# Patient Record
Sex: Male | Born: 1955 | Race: White | Hispanic: No | Marital: Single | Smoking: Current every day smoker
Health system: Southern US, Community
[De-identification: ages and names within clinical notes are randomized; demographics above are authoritative.]

## PROBLEM LIST (undated history)

## (undated) DIAGNOSIS — T8859XA Other complications of anesthesia, initial encounter: Secondary | ICD-10-CM

## (undated) DIAGNOSIS — M1990 Unspecified osteoarthritis, unspecified site: Secondary | ICD-10-CM

## (undated) DIAGNOSIS — R112 Nausea with vomiting, unspecified: Secondary | ICD-10-CM

## (undated) DIAGNOSIS — F32A Depression, unspecified: Secondary | ICD-10-CM

## (undated) DIAGNOSIS — Z9889 Other specified postprocedural states: Secondary | ICD-10-CM

## (undated) DIAGNOSIS — Z923 Personal history of irradiation: Secondary | ICD-10-CM

## (undated) DIAGNOSIS — R0600 Dyspnea, unspecified: Secondary | ICD-10-CM

## (undated) HISTORY — PX: NECK SURGERY: SHX720

## (undated) HISTORY — PX: SHOULDER SURGERY: SHX246

## (undated) HISTORY — DX: Personal history of irradiation: Z92.3

## (undated) HISTORY — PX: FACIAL RECONSTRUCTION SURGERY: SHX631

## (undated) HISTORY — PX: OTHER SURGICAL HISTORY: SHX169

---

## 2002-11-22 ENCOUNTER — Inpatient Hospital Stay (HOSPITAL_COMMUNITY): Admission: RE | Admit: 2002-11-22 | Discharge: 2002-11-23 | Payer: Self-pay | Admitting: Neurosurgery

## 2002-11-22 ENCOUNTER — Encounter: Payer: Self-pay | Admitting: Neurosurgery

## 2007-08-11 ENCOUNTER — Ambulatory Visit: Payer: Self-pay | Admitting: Emergency Medicine

## 2007-08-11 ENCOUNTER — Inpatient Hospital Stay (HOSPITAL_COMMUNITY): Admission: EM | Admit: 2007-08-11 | Discharge: 2007-08-15 | Payer: Self-pay | Admitting: Emergency Medicine

## 2007-08-12 ENCOUNTER — Ambulatory Visit: Payer: Self-pay | Admitting: Thoracic Surgery

## 2007-08-12 DIAGNOSIS — R042 Hemoptysis: Secondary | ICD-10-CM | POA: Insufficient documentation

## 2007-08-13 ENCOUNTER — Encounter: Payer: Self-pay | Admitting: Thoracic Surgery

## 2007-08-17 ENCOUNTER — Ambulatory Visit: Payer: Self-pay | Admitting: Critical Care Medicine

## 2007-08-17 DIAGNOSIS — K219 Gastro-esophageal reflux disease without esophagitis: Secondary | ICD-10-CM | POA: Insufficient documentation

## 2007-08-19 ENCOUNTER — Encounter: Payer: Self-pay | Admitting: Critical Care Medicine

## 2007-08-23 ENCOUNTER — Encounter: Admission: RE | Admit: 2007-08-23 | Discharge: 2007-08-23 | Payer: Self-pay | Admitting: Thoracic Surgery

## 2007-08-23 ENCOUNTER — Ambulatory Visit: Payer: Self-pay | Admitting: Thoracic Surgery

## 2007-08-24 ENCOUNTER — Ambulatory Visit (HOSPITAL_COMMUNITY): Admission: RE | Admit: 2007-08-24 | Discharge: 2007-08-24 | Payer: Self-pay | Admitting: Critical Care Medicine

## 2007-08-24 ENCOUNTER — Encounter: Payer: Self-pay | Admitting: Critical Care Medicine

## 2007-09-06 ENCOUNTER — Ambulatory Visit: Payer: Self-pay | Admitting: Thoracic Surgery

## 2007-09-18 ENCOUNTER — Ambulatory Visit: Payer: Self-pay | Admitting: Critical Care Medicine

## 2007-09-18 DIAGNOSIS — F17200 Nicotine dependence, unspecified, uncomplicated: Secondary | ICD-10-CM

## 2007-09-18 DIAGNOSIS — J449 Chronic obstructive pulmonary disease, unspecified: Secondary | ICD-10-CM

## 2010-02-03 ENCOUNTER — Ambulatory Visit (HOSPITAL_BASED_OUTPATIENT_CLINIC_OR_DEPARTMENT_OTHER): Admission: RE | Admit: 2010-02-03 | Discharge: 2010-02-03 | Payer: Self-pay | Admitting: Orthopedic Surgery

## 2010-04-11 ENCOUNTER — Encounter: Payer: Self-pay | Admitting: Critical Care Medicine

## 2010-04-12 ENCOUNTER — Encounter: Payer: Self-pay | Admitting: Thoracic Surgery

## 2010-08-03 NOTE — Discharge Summary (Signed)
NAME:  Brendan Lara, Brendan Lara NO.:  [id number]   MEDICAL RECORD NO.:  [id number]          PATIENT TYPE:  INP   LOCATION:  3039                         FACILITY:  [facility]   PHYSICIAN:  Charlcie Cradle. Delford Field, MD, FCCPDATE OF BIRTH:  May 14, 1955   DATE OF ADMISSION:  08/11/2007  DATE OF DISCHARGE:  08/15/2007                               DISCHARGE SUMMARY   DISCHARGE DIAGNOSES:  1. Hemoptysis.  2. Left lower lobe pulmonary, superior segmental lesion.  3. Nicotine withdrawal.   PROCEDURES:  Video bronchoscopy with biopsy performed on Aug 13, 2007.   LABORATORY DATA:  Sputum fungal smear on Aug 13, 2007, demonstrates no  yeast or fungal elements.  AFB with smear preliminary data obtained on  Aug 13, 2007, demonstrates no acid-fast bacilli.  Bronchial alveolar  lavage completed on Aug 12, 2007, demonstrates no growth.  Sputum  culture on Aug 13, 2007, demonstrates no organisms.  Aug 12, 2007,  sodium 139, potassium 3.7, chloride 106, CO2 24, glucose 102, BUN 14,  and creatinine 0.97.  CBC on Aug 12, 2007, white blood cell count 6,  hemoglobin 15.1, hematocrit 44.3, and platelet count 151.   PATHOLOGY:  Surgical pathology obtained on Aug 13, 2007, demonstrates  benign bronchial mucosa.  No granulomatous inflammation, atypia, or  evidence of malignancy identified.  Cytology obtained on Aug 13, 2007,  demonstrates no atypia or evidence of malignancy in sections identified.   BRIEF HISTORY:  This is a 55 year old white male patient who works in  [employer] demonstrated acute onset of severe hemoptysis, coughing up  at least whole cup of blood into the emergency room.  He was admitted by  Dr. Shan Levans, underwent bronchoscopy and CT scan.  The CT scan  showed areas of alveolitis, and the superior segment of the left lower  lobe, as well as bronchoscopy diagnosing superior segmental lesion in  the left lower lobe that is exophytic.  There were no biopsies done at  that point.   He was having low grade hemoptysis.  He was admitted for  further evaluation and therapy.   HOSPITAL COURSE:  1. Hemoptysis in the setting of left lower lobe superior segment lung      lesion.  Brendan Lara was admitted to the intensive care.  Treatment      included cough suppression, supplemental oxygen, and close      observation.  He also underwent video bronchoscopy with biopsies      obtained.  There were pathology came back without evidence of      malignancy.  However, in the setting of his bleeding lesion,      further evaluation will be carried out in the outpatient setting.      He has plans in the outpatient setting to probably undergo PET scan      after followup with Dr. Edwyna Shell with perhaps question of lobectomy.      This will be deferred to Dr. Edwyna Shell in the outpatient setting.  2. Hemoptysis secondary to problem #1.  This has improved.  The      patient will be discharged to home  with p.r.n. Tussionex.  He was      instructed should this return to call EMS and report to [facility]      Emergency Room.  3. Anxiety most likely in the setting of nicotine withdrawal.  For      this, he has been prescribed to 76-month supply of as needed Xanax.      He was instructed to continue smoking cessation, which he agrees to      do.   DISCHARGE INSTRUCTIONS:  Diet as tolerated.  Increase activity as  tolerated.  Followup Dr. Shan Levans, Friday, Aug 17, 2007, at 10:15  a.m. also with Dr. Karle Plumber in approximately 1 week's time.  His  office will contact Brendan Lara.   DISCHARGE MEDICATIONS:  1. Avelox 400 mg tablet one daily x 5 days then discontinue.  2. QVAR inhaler 40 mcg per spray 2 puffs twice a day.  3. Xanax 0.5 mg tablet 1 tablet as needed every 8 hours for anxiety.  4. Tussionex 1 teaspoon every 12 hours p.r.n. for cough.   DISPOSITION:  Brendan Lara has met maximum benefit from inpatient  hospitalization.  He is being discharged to home in improved status.  He  will  be followed up in the outpatient setting.      Zenia Resides, NP      Charlcie Cradle. Delford Field, MD, [facility]  Electronically Signed    PB/MEDQ  D:  08/15/2007  T:  08/15/2007  Job:  [id number]

## 2010-08-03 NOTE — H&P (Signed)
NAME:  Brendan Lara, Brendan Lara NO.:  [id number]   MEDICAL RECORD NO.:  [id number]          PATIENT TYPE:  EMS   LOCATION:  MAJO                         FACILITY:  [facility]   PHYSICIAN:  Charlcie Cradle. Delford Field, MD, FCCPDATE OF BIRTH:  March 15, 1956   DATE OF ADMISSION:  08/11/2007  DATE OF DISCHARGE:                              HISTORY & PHYSICAL   CHIEF COMPLAINT:  Hemoptysis.   HISTORY OF PRESENT ILLNESS:  This 55 year old white male developed at  9:30 am onset of bright red blood that he is coughing up.  There is  evidence of aspirated blood in the left lower lobe, on CT scan.  We are  asked to admit the patient.  The patient essentially has negative past  medical history, __________ packs per day of cigarettes.  He denied  chest pain.  No fever.  He has not had previous bronchitis or pneumonia.  He is admitted now for further inpatient care.   PAST MEDICAL HISTORY:  Essentially negative.   ALLERGIES:  None.   MEDICATIONS:  Prior to admission none.   FAMILY HISTORY:  Positive for emphysema exposure.   PAST SURGICAL HISTORY:  Negative.   REVIEW OF SYSTEMS:  Noncontributory.   PHYSICAL EXAMINATIONS:  VITAL SIGNS:  Temperature is 97.8, blood  pressure was 116/82, and saturation 97% room air, pulse 74.  HEENT:  Showed no jugular distention.  No lymphadenopathy.  Oropharynx  clear.  NECK:  Supple.  CHEST:  Showed rhonchi, left greater than right.  CARDIAC:  Showed regular rate and rhythm without S3.  Normal S1 and S2.  ABDOMEN:  Soft and nontender.  EXTREMITIES:  Showed no edema or clubbing.  SKIN:  Clear.  NEUROLOGIC:  Intact.   CT scan of chest shows ground-glass changes left lower lobe, otherwise  unremarkable.  There is an apical scar in the right lung 7.3, hemoglobin  16.9, troponin less than 0.05, INR 0.9, PTT 32.  Sodium 140, potassium  4.2, chloride 104, CO2 is 27 with blood sugar 94, creatinine 1.2, and  hemoglobin 17.3.   IMPRESSION:  Massive hemoptysis in a  patient with essentially negative  chest x-ray likely airway involvement, rule out malignancy.   RECOMMENDATIONS:  Pursue bronchoscopy and admit to intensive care bed  and we will follow expectantly.  He will pursue bronchoscopy tonight,  administer IV Rocephin.      Charlcie Cradle Delford Field, MD, [facility]  Electronically Signed     PEW/MEDQ  D:  08/11/2007  T:  08/12/2007  Job:  [id number]

## 2010-08-03 NOTE — Letter (Signed)
August 23, 2007   Charlcie Cradle. Delford Field, MD, FCCP  [address]  [place],  [place] [address]   Re:  GAREN, WOOLBRIGHT              DOB:  [birth year]   Dear Dennie Bible:   I saw Mr. Vernor back in the office today and we got a chest x-ray today  that showed no more changes in his lung. He has had no more hemoptysis  since he went home. He has a PET scan scheduled tomorrow and then we  will see him back again in 2 weeks.  I hope that this is just an  inflammatory situation that caused his bleeding.  I explained to him  that that could be the case or it could be a small occult cancer.  I  will wait to see what his PET scan shows. His blood pressure was 114/75,  pulse 60, respirations 18, sats were 98%.  Lungs are clear to  auscultation and percussion.   Ines Bloomer, M.D.  Electronically Signed   DPB/MEDQ  D:  08/23/2007  T:  08/23/2007  Job:  [id number]

## 2010-08-03 NOTE — Op Note (Signed)
NAMEJEROMY, BORCHERDING NO.:  [id number]   MEDICAL RECORD NO.:  [id number]          PATIENT TYPE:  INP   LOCATION:  2107                         FACILITY:  [facility]   PHYSICIAN:  Ines Bloomer, M.D. DATE OF BIRTH:  09-Apr-1955   DATE OF PROCEDURE:  DATE OF DISCHARGE:                               OPERATIVE REPORT   PREOPERATIVE DIAGNOSIS:  Hemoptysis.   POSTOPERATIVE DIAGNOSIS:  Hemoptysis.   OPERATION PERFORMED:  Video bronchoscopy with biopsy.   SURGEON:  Ines Bloomer, M.D.   ANESTHESIA:  General anesthesia.   This patient came in with fairly acute onset of hemoptysis.  Bronchoscopy by Dr. Shan Levans saw an exophytic lesion in the  superior segment of the left lower lobe.  A CT scan showed airspace  disease in the left lower lobe.  He is brought to the operating room for  biopsy after general anesthesia.  The video bronchoscope was passed  through the endotracheal tube.  The carina was in midline.  The right  upper lobe, right middle lobe, and right lower lobe orifices and  subsegmental orifices were all normal.  The bronchus intermedius and the  right mainstem was normal.  The left mainstem was normal.  However, on  going down to the distal left mainstem, the left upper lobe was  completely cared and there was blood clot occluding the left lower lobe.  The blood clot was irrigated out and the basilar segments of the left  lower lobe were examined.  No endobronchial lesions were seen.  Then, in  the superior segment, more blood clot was irrigated out and we examined  that 3/3 subsegments, and the one of them, there was irregularity in the  orifice and brushings were taken from this area, and then biopsies were  taken of this irregularity.  There was no more bleeding.  The video  bronchoscope was removed.  The patient tolerated the procedure well and  was turned to recovery room in stable condition.      Ines Bloomer, M.D.  Electronically  Signed     DPB/MEDQ  D:  08/13/2007  T:  08/13/2007  Job:  [id number]

## 2010-08-03 NOTE — Letter (Signed)
September 06, 2007   Charlcie Cradle. Delford Field, MD, FCCP  [address]  [place], [place] [address]   Re:  RONNIE, DOO              DOB:  11-05-1955   Dear Dennie Bible,   I saw this patient in the office today and reviewed his CT scan.  He has  not smoked since he was in the hospital.  His CT and PET scan showed no  evidence of cancer and ground-glass appearance in the superior segment  of the left lower lobe now cleared, this was probably secondary to  hemorrhage.  Given that, we have no area to resect, I think the best  thing to do is just to follow him.  He will see you on 06/30/ 2009, and  I will see him in 6 weeks with a chest x-ray.  It might be worth  repeating his bronchoscopy at some point down the line.   I appreciate the opportunity of seeing this patient.   Sincerely,   Ines Bloomer, M.D.  Electronically Signed   DPB/MEDQ  D:  09/06/2007  T:  09/07/2007  Job:  [id number]

## 2010-08-03 NOTE — H&P (Signed)
NAME:  Brendan Lara, Brendan Lara NO.:  [id number]   MEDICAL RECORD NO.:  [id number]           PATIENT TYPE:   LOCATION:                                 FACILITY:   PHYSICIAN:  Ines Bloomer, M.D. DATE OF BIRTH:  [birth year]   DATE OF ADMISSION:  DATE OF DISCHARGE:                              HISTORY & PHYSICAL   CHIEF COMPLAINT:  Hemoptysis.   HISTORY OF PRESENT ILLNESS:  This is a 55 year old Caucasian male, who  works in [employer] had an onset of severe hemoptysis, coughing up at  least cup full of blood into the emergency room and was admitted by Dr.  Shan Levans and underwent a bronchoscopy and a CT scan.  The CT scan  showed areas of alveolitis in the superior segment of the left lower  lobe secondary to probably bleeding.  Bronchoscopy revealed a superior  segmental lesion on left lower lobe that is exophytic.  No biopsy was  done.  He is still having low-grade hemoptysis.  He has had no weight  loss, no fever, chills, and excessive sputum.  He smokes at least 1-2  packs a day.  He has been smoking from many years.  He does not drink  alcohol on regular basis.   PAST MEDICAL HISTORY:  Significant for splenomegaly.  His average PT and  PTT are normal and his platelets are in 78,000.   FAMILY HISTORY:  Noncontributory.   SOCIAL HISTORY:  He works in [employer].  Smokes 1-2 packs a day.  History of cannabis abuse.  He is not consuming alcohol.   REVIEW OF SYSTEMS:  CARDIAC:  No angio or atrial fibrillation.  PULMONARY:  See history of present illness.  He has had some left lower  lobe chest pain.  GI:  No nausea, vomiting, constipation, or diarrhea.  GU:  No dysuria or frequent urination, or kidney disease.  VASCULAR:  No  claudication, DVT, TIAs.  NEUROLOGICAL:  Dizziness, headaches,  blackouts, or seizures.  MUSCULOSKELETAL:  No arthritis or joint pain or  aches.  HEMATOLOGICAL:  The patient has history of splenomegaly.  No  clotting disorders or  bleeding disorders or anemia.  EYES/ENT:  No  changes in eyes or hearing.  PSYCHIATRIC:  No psychiatric illnesses.   PHYSICAL EXAM:  GENERAL:  He is a well pleasant Caucasian male in not  acute distress.  VITAL SIGNS:  His sats were 97%, his blood pressure is 103/80, pulse  100, and respirations 18.  EARS, NOSE, AND THROAT:  Unremarkable.  NECK:  Supple without thyromegaly.  There is no supraclavicular, or  axially adenopathy.  CHEST:  Clear to auscultation and percussion.  HEART:  Regular sinus rhythm.  No murmurs.  ABDOMEN:  Soft.  There is no hepatosplenomegaly.  EXTREMITIES:  Pulses are 2+.  There is no clubbing or edema.  NEUROLOGIC:  He is orient x3.  Sensory and motor intact.  Cranial nerves  were intact.   IMPRESSION:  1. Hemoptysis.  2. Probable pleural ablation.  3. Tobacco abuse.  4. History of splenomegaly.   PLAN:  1. Repeat bronchoscopy with laser  standby.  Biopsy left lower lobe      lesion.  2. Pulmonary function test.  3. PET scan.      Ines Bloomer, M.D.  Electronically Signed     DPB/MEDQ  D:  08/12/2007  T:  08/13/2007  Job:  [id number]

## 2010-12-15 LAB — PROTIME-INR
INR: 0.9
Prothrombin Time: 12.6

## 2010-12-15 LAB — CULTURE, BAL-QUANTITATIVE W GRAM STAIN: Colony Count: NO GROWTH

## 2010-12-15 LAB — CBC
MCHC: 33.8
MCHC: 34
MCV: 94.5
MCV: 94.8
Platelets: 151
Platelets: 178
RDW: 13.4
RDW: 13.6
WBC: 6

## 2010-12-15 LAB — POCT I-STAT, CHEM 8
Calcium, Ion: 1.16
Chloride: 104
Glucose, Bld: 94
HCT: 51

## 2010-12-15 LAB — COMPREHENSIVE METABOLIC PANEL
AST: 14
Albumin: 3.4 — ABNORMAL LOW
Calcium: 8.2 — ABNORMAL LOW
Chloride: 106
Creatinine, Ser: 0.97
GFR calc Af Amer: >60
Total Protein: 5.7 — ABNORMAL LOW

## 2010-12-15 LAB — FUNGUS CULTURE W SMEAR

## 2010-12-15 LAB — CULTURE, RESPIRATORY W GRAM STAIN: Culture: NO GROWTH

## 2010-12-15 LAB — DIFFERENTIAL
Basophils Relative: 1
Eosinophils Absolute: 0.2
Neutrophils Relative %: 59

## 2010-12-15 LAB — AFB CULTURE WITH SMEAR (NOT AT ARMC): Acid Fast Smear: NONE SEEN

## 2010-12-15 LAB — POCT CARDIAC MARKERS: Troponin i, poc: <0.05

## 2013-07-23 ENCOUNTER — Ambulatory Visit
Admission: RE | Admit: 2013-07-23 | Discharge: 2013-07-23 | Disposition: A | Discharge status: Home / Self Care | Payer: No Typology Code available for payment source | Source: Ambulatory Visit | Attending: Physician Assistant | Admitting: Physician Assistant

## 2013-07-23 ENCOUNTER — Other Ambulatory Visit: Payer: Self-pay | Admitting: Physician Assistant

## 2013-07-23 DIAGNOSIS — R05 Cough: Secondary | ICD-10-CM

## 2013-07-23 DIAGNOSIS — R059 Cough, unspecified: Secondary | ICD-10-CM

## 2013-07-23 DIAGNOSIS — R634 Abnormal weight loss: Secondary | ICD-10-CM

## 2013-07-23 DIAGNOSIS — F17200 Nicotine dependence, unspecified, uncomplicated: Secondary | ICD-10-CM

## 2013-08-01 ENCOUNTER — Other Ambulatory Visit: Payer: Self-pay | Admitting: Family Medicine

## 2013-08-01 DIAGNOSIS — R109 Unspecified abdominal pain: Secondary | ICD-10-CM

## 2013-08-05 ENCOUNTER — Ambulatory Visit
Admission: RE | Admit: 2013-08-05 | Discharge: 2013-08-05 | Disposition: A | Discharge status: Home / Self Care | Payer: No Typology Code available for payment source | Source: Ambulatory Visit | Attending: Family Medicine | Admitting: Family Medicine

## 2013-08-05 DIAGNOSIS — R109 Unspecified abdominal pain: Secondary | ICD-10-CM

## 2013-08-15 ENCOUNTER — Other Ambulatory Visit (HOSPITAL_COMMUNITY): Payer: Self-pay | Admitting: Gastroenterology

## 2013-08-15 DIAGNOSIS — R1013 Epigastric pain: Secondary | ICD-10-CM

## 2013-08-21 ENCOUNTER — Other Ambulatory Visit: Payer: Self-pay | Admitting: Gastroenterology

## 2013-08-29 ENCOUNTER — Ambulatory Visit (HOSPITAL_COMMUNITY)
Admission: RE | Admit: 2013-08-29 | Discharge: 2013-08-29 | Disposition: A | Discharge status: Home / Self Care | Payer: No Typology Code available for payment source | Source: Ambulatory Visit | Attending: Gastroenterology | Admitting: Gastroenterology

## 2013-08-29 DIAGNOSIS — R1013 Epigastric pain: Secondary | ICD-10-CM | POA: Insufficient documentation

## 2013-08-29 MED ORDER — TECHNETIUM TC 99M MEBROFENIN IV KIT
5.1000 | PACK | Freq: Once | INTRAVENOUS | Status: AC | PRN
  Administered 2013-08-29: 5 via INTRAVENOUS

## 2014-11-24 IMAGING — NM NM HEPATO W/GB/PHARM/[PERSON_NAME]
1 series · 12 of 12 positions shown · non-contrast
Comparison: None.

CLINICAL DATA: Epigastric region pain

EXAM:
NUCLEAR MEDICINE HEPATOBILIARY IMAGING WITH GALLBLADDER EF
Views:  Anterior right upper quadrant
Radionuclide:  Technetium 99m Choletec
Dose:  5.1 mCi
Route of administration: Intravenous

[Series 1: gbef ensure · 4.46mm/px · 2 acquisitions, 12 frames shown]
[im 1/2]
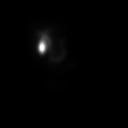
[im 1/2]
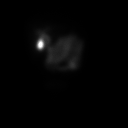
[im 1/2]
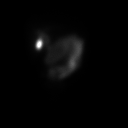
[im 1/2]
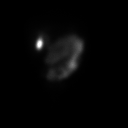
[im 1/2]
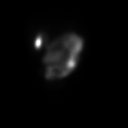
[im 1/2]
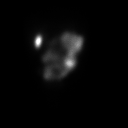
[im 2/2]
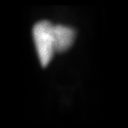
[im 2/2]
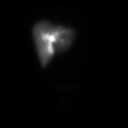
[im 2/2]
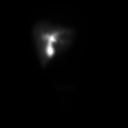
[im 2/2]
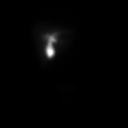
[im 2/2]
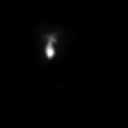
[im 2/2]
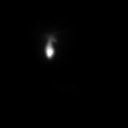

[12 of 12 positions shown; findings below may reference images not displayed]

FINDINGS: Liver uptake of radiotracer is normal. There is prompt visualization
of gallbladder and small bowel, indicating patency of the cystic and
common bile ducts.

The patient consumed 8 oz of Ensure Plus orally with calculation of
the computer generated ejection fraction of radiotracer from the
gallbladder. The patient did not experience pain with Ensure Plus
consumption. The computer generated ejection fraction of radiotracer
from the gallbladder is normal at 79.1%, normal greater than 33%
using the oral agent.
IMPRESSION: Study within normal limits.

## 2015-05-21 ENCOUNTER — Ambulatory Visit
Admission: RE | Admit: 2015-05-21 | Discharge: 2015-05-21 | Disposition: A | Discharge status: Home / Self Care | Payer: No Typology Code available for payment source | Source: Ambulatory Visit | Attending: Cardiology | Admitting: Cardiology

## 2015-05-21 ENCOUNTER — Other Ambulatory Visit: Payer: Self-pay | Admitting: Cardiology

## 2015-05-21 DIAGNOSIS — R0789 Other chest pain: Secondary | ICD-10-CM

## 2015-09-11 ENCOUNTER — Other Ambulatory Visit: Payer: Self-pay | Admitting: Gastroenterology

## 2016-08-15 IMAGING — CR DG CHEST 2V
2 series · 2 of 2 positions shown · non-contrast
Comparison: 07/23/2013

CLINICAL DATA: Cough and congestion

EXAM:
CHEST  2 VIEW

[w chest pa]
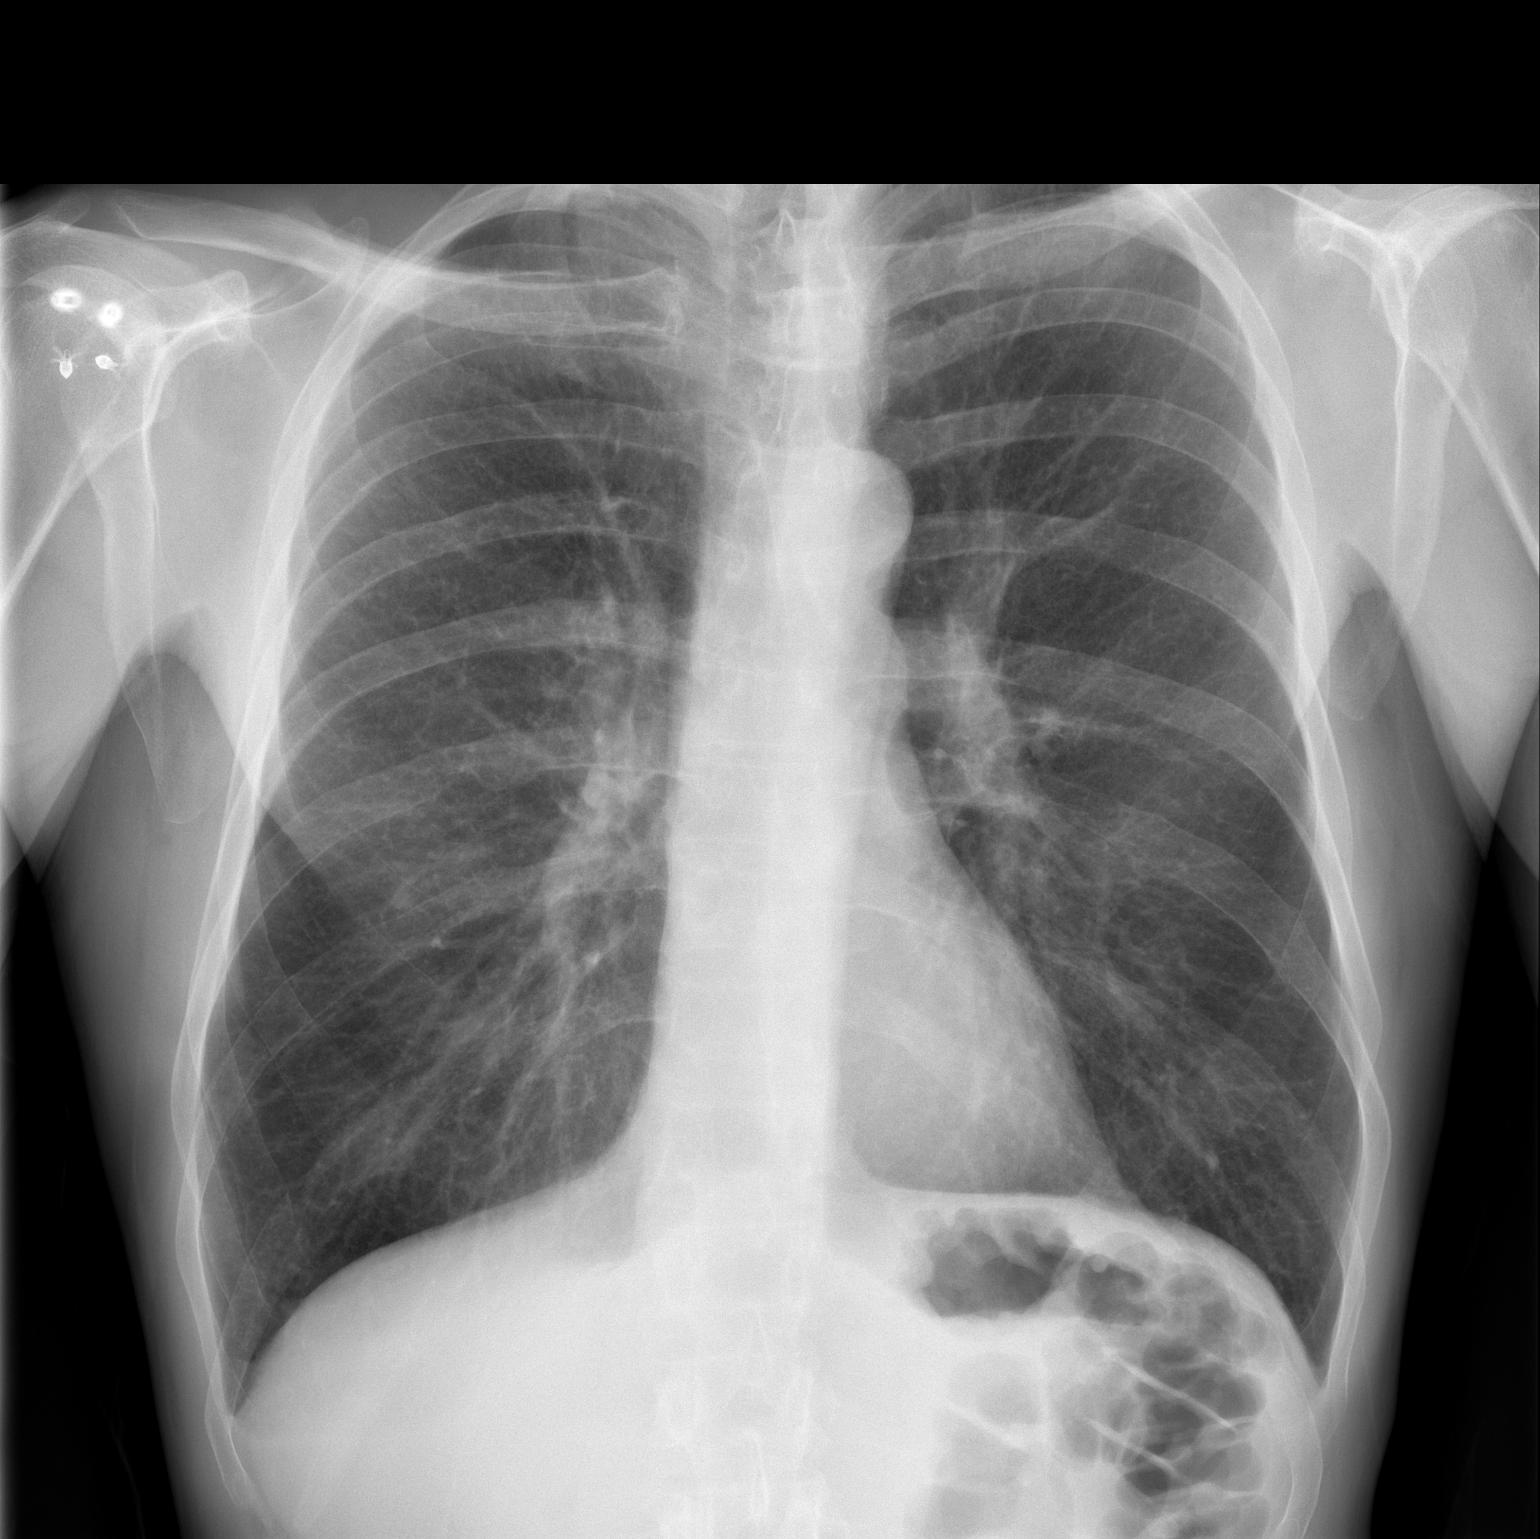

[w chest lat]
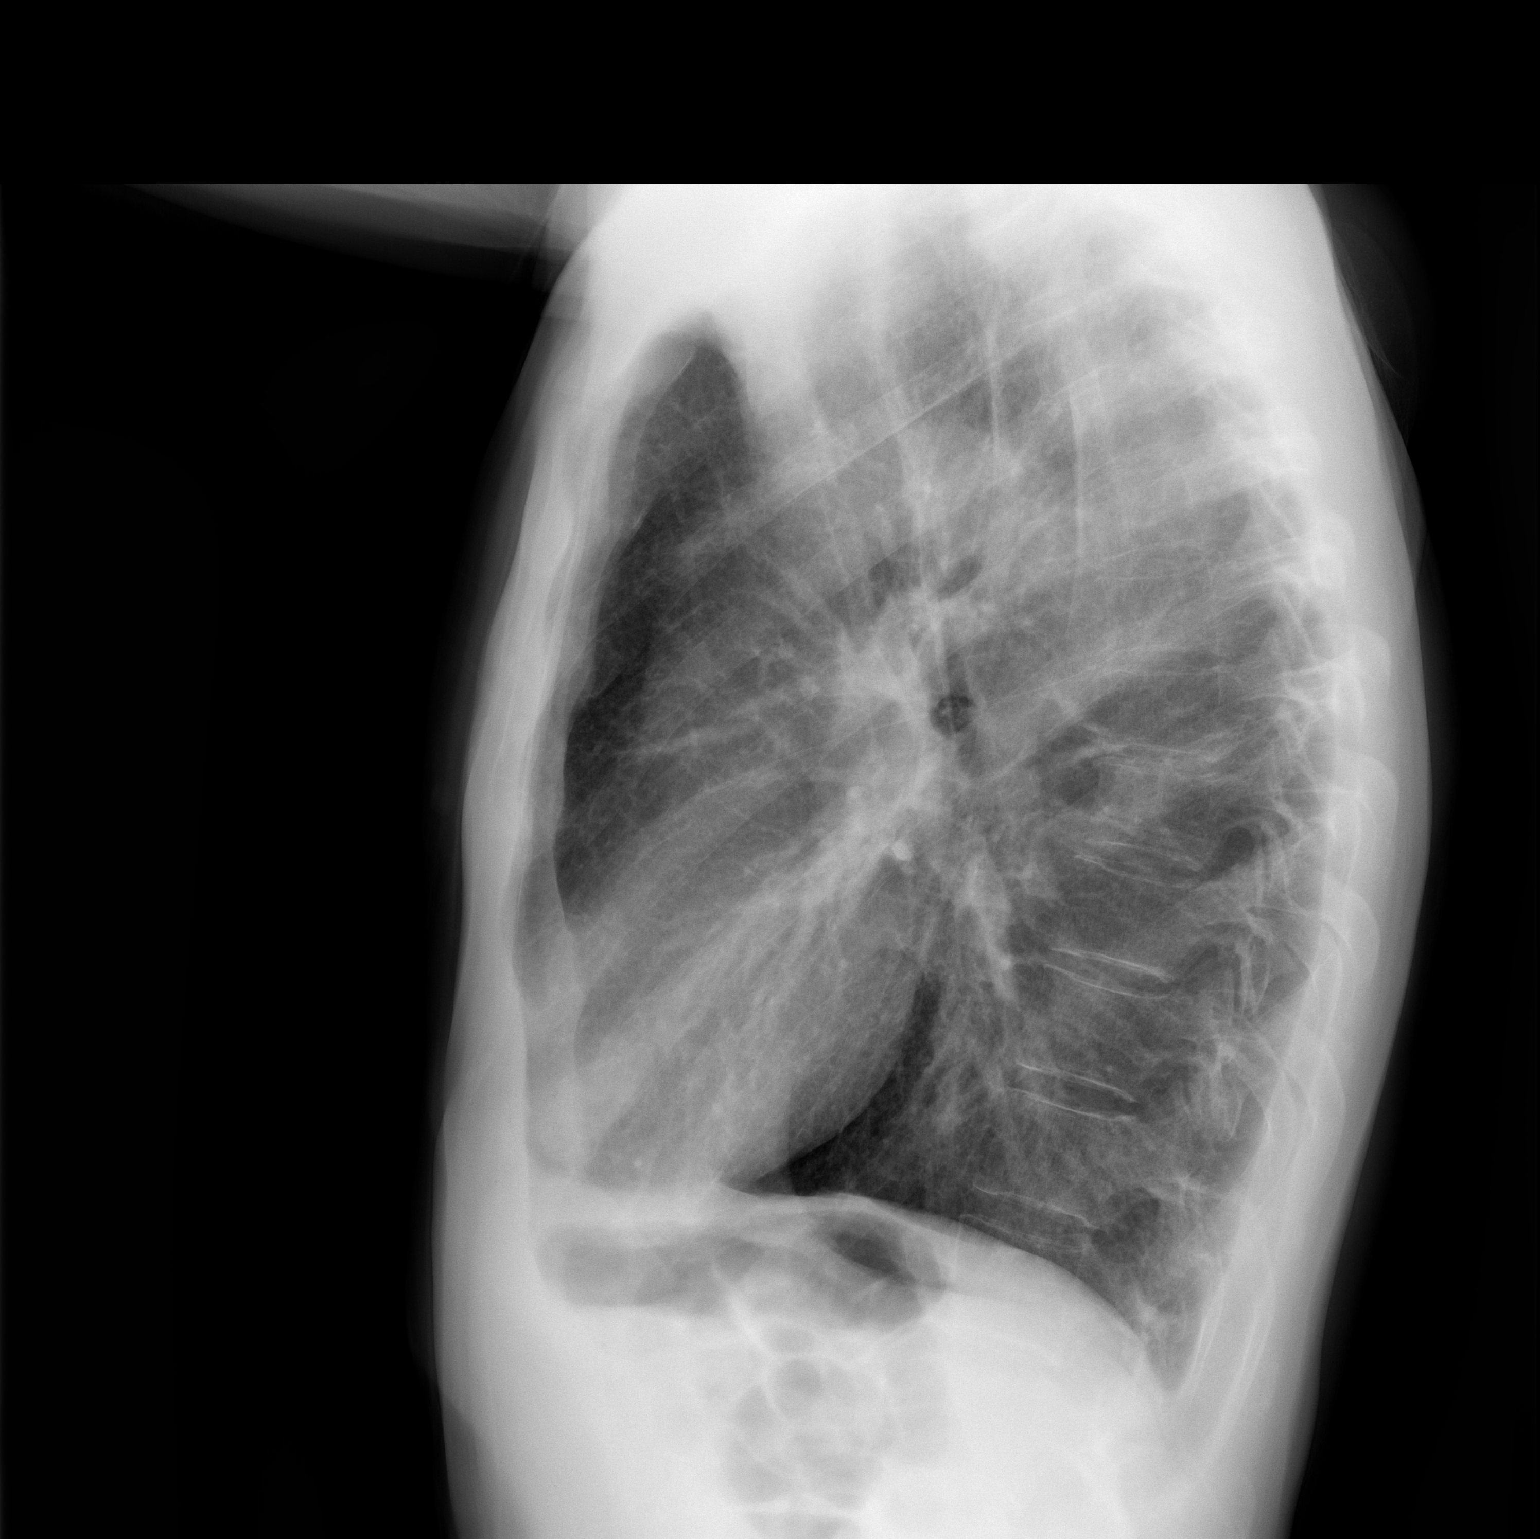

[2 of 2 positions shown; findings below may reference images not displayed]

FINDINGS: Cardiac shadow is within normal limits. The lungs are clear
bilaterally. Mild hyperinflation is noted consistent with COPD.
Postsurgical changes in the right shoulder and cervical spine are
seen.
IMPRESSION: COPD without acute abnormality.

## 2018-05-18 ENCOUNTER — Emergency Department (HOSPITAL_COMMUNITY)
Admission: EM | Admit: 2018-05-18 | Discharge: 2018-05-18 | Disposition: A | Discharge status: Home / Self Care | Payer: No Typology Code available for payment source | Attending: Emergency Medicine | Admitting: Emergency Medicine

## 2018-05-18 ENCOUNTER — Emergency Department (HOSPITAL_COMMUNITY): Payer: No Typology Code available for payment source

## 2018-05-18 ENCOUNTER — Other Ambulatory Visit: Payer: Self-pay

## 2018-05-18 ENCOUNTER — Encounter (HOSPITAL_COMMUNITY): Payer: Self-pay

## 2018-05-18 DIAGNOSIS — R05 Cough: Secondary | ICD-10-CM

## 2018-05-18 DIAGNOSIS — R059 Cough, unspecified: Secondary | ICD-10-CM

## 2018-05-18 DIAGNOSIS — F17210 Nicotine dependence, cigarettes, uncomplicated: Secondary | ICD-10-CM | POA: Insufficient documentation

## 2018-05-18 DIAGNOSIS — E876 Hypokalemia: Secondary | ICD-10-CM | POA: Insufficient documentation

## 2018-05-18 DIAGNOSIS — J189 Pneumonia, unspecified organism: Secondary | ICD-10-CM | POA: Insufficient documentation

## 2018-05-18 LAB — CBC
HCT: 46.5 % (ref 39.0–52.0)
Hemoglobin: 15.9 g/dL (ref 13.0–17.0)
MCH: 31.5 pg (ref 26.0–34.0)
MCHC: 34.2 g/dL (ref 30.0–36.0)
MCV: 92.3 fL (ref 80.0–100.0)
Platelets: 149 10*3/uL — ABNORMAL LOW (ref 150–400)
RBC: 5.04 MIL/uL (ref 4.22–5.81)
RDW: 12.8 % (ref 11.5–15.5)
WBC: 4.9 10*3/uL (ref 4.0–10.5)
nRBC: 0 % (ref 0.0–0.2)

## 2018-05-18 LAB — BASIC METABOLIC PANEL
Anion gap: 12 (ref 5–15)
BUN: 14 mg/dL (ref 8–23)
CHLORIDE: 91 mmol/L — AB (ref 98–111)
CO2: 29 mmol/L (ref 22–32)
CREATININE: 0.71 mg/dL (ref 0.61–1.24)
Calcium: 8.4 mg/dL — ABNORMAL LOW (ref 8.9–10.3)
GFR calc Af Amer: >60 mL/min (ref >60)
GFR calc non Af Amer: >60 mL/min (ref >60)
Glucose, Bld: 114 mg/dL — ABNORMAL HIGH (ref 70–99)
Potassium: 3 mmol/L — ABNORMAL LOW (ref 3.5–5.1)
Sodium: 132 mmol/L — ABNORMAL LOW (ref 135–145)

## 2018-05-18 LAB — I-STAT TROPONIN, ED: Troponin i, poc: 0.02 ng/mL (ref 0.00–0.08)

## 2018-05-18 MED ORDER — ONDANSETRON 4 MG PO TBDP: 4.0000 mg | ORAL_TABLET | Freq: Three times a day (TID) | ORAL | 0 refills | Status: DC | PRN

## 2018-05-18 MED ORDER — DOXYCYCLINE HYCLATE 100 MG PO TABS
100.0000 mg | ORAL_TABLET | Freq: Once | ORAL | Status: AC
  Administered 2018-05-18: 100 mg via ORAL
  Filled 2018-05-18: qty 1

## 2018-05-18 MED ORDER — SODIUM CHLORIDE 0.9 % IV BOLUS
1000.0000 mL | Freq: Once | INTRAVENOUS | Status: AC
  Administered 2018-05-18: 1000 mL via INTRAVENOUS

## 2018-05-18 MED ORDER — PREDNISONE 20 MG PO TABS
60.0000 mg | ORAL_TABLET | Freq: Once | ORAL | Status: AC
  Administered 2018-05-18: 60 mg via ORAL
  Filled 2018-05-18: qty 3

## 2018-05-18 MED ORDER — PREDNISONE 20 MG PO TABS: 40.0000 mg | ORAL_TABLET | Freq: Every day | ORAL | 0 refills | Status: AC

## 2018-05-18 MED ORDER — CYCLOBENZAPRINE HCL 10 MG PO TABS: 10.0000 mg | ORAL_TABLET | Freq: Two times a day (BID) | ORAL | 0 refills | Status: DC | PRN

## 2018-05-18 MED ORDER — POTASSIUM CHLORIDE CRYS ER 20 MEQ PO TBCR
40.0000 meq | EXTENDED_RELEASE_TABLET | Freq: Once | ORAL | Status: AC
  Administered 2018-05-18: 40 meq via ORAL
  Filled 2018-05-18: qty 2

## 2018-05-18 NOTE — Discharge Instructions (Addendum)
You were evaluated in the Emergency Department and after careful evaluation, we did not find any emergent condition requiring admission or further testing in the hospital.  Your symptoms today seem to be due to pneumonia.  Please continue to take the antibiotics at home.  We are also providing you with a steroid medication as well as muscle relaxers for your soreness.  The muscle relaxers can make you sleepy.  Use the zofran nausea medication to help increase your diet.  Please return to the Emergency Department if you experience any worsening of your condition.  We encourage you to follow up with a primary care provider.  Thank you for allowing [place] to be a part of your care.

## 2018-05-18 NOTE — ED Provider Notes (Signed)
[facility] Emergency Department Provider Note MRN:  [id number]  Arrival date & time: 05/18/18     Chief Complaint   sent by physician; Pneumonia; and Chest Pain   History of Present Illness   Brendan Lara is a 63 y.o. year-old male with a history of tobacco abuse presenting to the ED with chief complaint of cough.  Patient has been experiencing cold-like symptoms for 1 week.  Nasal congestion, cough, general malaise, body aches.  Felt better for 1 to 2 days, now feeling worse.  Continued cough.  Diagnosed with pneumonia yesterday and was told to come to the emergency department for fluids and antibiotics.  Felt too weak to go to the hospital yesterday, here today.  Endorsing continued fever at home, generalized weakness, chest pain to the bilateral lower ribs during coughing.  Denies abdominal pain.  Symptoms are constant, no exacerbating relieving factors.  Review of Systems  A complete 10 system review of systems was obtained and all systems are negative except as noted in the HPI and PMH.   Patient's Health History   History reviewed. No pertinent past medical history.  Past Surgical History:  Procedure Laterality Date  . FACIAL RECONSTRUCTION SURGERY    . head surgery    . NECK SURGERY    . SHOULDER SURGERY      Family History  Problem Relation Age of Onset  . Thyroid disease Mother   . Alzheimer's disease Mother   . Heart failure Father     Social History   Socioeconomic History  . Marital status: Single    Spouse name: Not on file  . Number of children: Not on file  . Years of education: Not on file  . Highest education level: Not on file  Occupational History  . Not on file  Social Needs  . Financial resource strain: Not on file  . Food insecurity:    Worry: Not on file    Inability: Not on file  . Transportation needs:    Medical: Not on file    Non-medical: Not on file  Tobacco Use  . Smoking status: Current Every Day Smoker   Packs/day: 1.50    Types: Cigarettes  . Smokeless tobacco: Never Used  Substance and Sexual Activity  . Alcohol use: Not Currently  . Drug use: Yes    Types: Marijuana    Comment: once weekly  . Sexual activity: Not on file  Lifestyle  . Physical activity:    Days per week: Not on file    Minutes per session: Not on file  . Stress: Not on file  Relationships  . Social connections:    Talks on phone: Not on file    Gets together: Not on file    Attends religious service: Not on file    Active member of club or organization: Not on file    Attends meetings of clubs or organizations: Not on file    Relationship status: Not on file  . Intimate partner violence:    Fear of current or ex partner: Not on file    Emotionally abused: Not on file    Physically abused: Not on file    Forced sexual activity: Not on file  Other Topics Concern  . Not on file  Social History Narrative  . Not on file     Physical Exam  Vital Signs and Nursing Notes reviewed Vitals:   05/18/18 1659 05/18/18 1943  BP: 113/71 110/78  Pulse: 70  64  Resp: 15 20  Temp: 97.7 F (36.5 C)   SpO2: 93% 96%    CONSTITUTIONAL: Tired-appearing, NAD NEURO:  Alert and oriented x 3, no focal deficits EYES:  eyes equal and reactive ENT/NECK:  no LAD, no JVD CARDIO: Regular rate, well-perfused, normal S1 and S2 PULM:  CTAB no wheezing or rhonchi GI/GU:  normal bowel sounds, non-distended, non-tender MSK/SPINE:  No gross deformities, no edema SKIN:  no rash, atraumatic PSYCH:  Appropriate speech and behavior  Diagnostic and Interventional Summary    EKG Interpretation  Date/Time:  Friday May 18 2018 17:00:45 EST Ventricular Rate:  72 PR Interval:    QRS Duration: 94 QT Interval:  417 QTC Calculation: 457 R Axis:   53 Text Interpretation:  Sinus or ectopic atrial rhythm Atrial premature complexes Baseline wander in lead(s) V1 wandering baseline otherwise nornal no change from previous Confirmed by  Arby Barrette [phone]) on 05/18/2018 5:17:02 PM      Labs Reviewed  CBC - Abnormal; Notable for the following components:      Result Value   Platelets 149 (*)    All other components within normal limits  BASIC METABOLIC PANEL - Abnormal; Notable for the following components:   Sodium 132 (*)    Potassium 3.0 (*)    Chloride 91 (*)    Glucose, Bld 114 (*)    Calcium 8.4 (*)    All other components within normal limits  I-STAT TROPONIN, ED    DG Chest 2 View  Final Result      Medications  predniSONE (DELTASONE) tablet 60 mg (has no administration in time range)  doxycycline (VIBRA-TABS) tablet 100 mg (has no administration in time range)  potassium chloride SA (K-DUR,KLOR-CON) CR tablet 40 mEq (has no administration in time range)  sodium chloride 0.9 % bolus 1,000 mL (1,000 mLs Intravenous New Bag/Given 05/18/18 2011)     Procedures Critical Care  ED Course and Medical Decision Making  I have reviewed the triage vital signs and the nursing notes.  Pertinent labs & imaging results that were available during my care of the patient were reviewed by me and considered in my medical decision making (see below for details).  Patient is with normal vital signs and clear lungs, appears tired, question of whether providers yesterday wanted him to be admitted for some reason, according to patient was told to come to get IV fluids, will obtain labs to evaluate for AKI, repeat chest x-ray, ambulatory oxygenation test.  Has been taking doxycycline at home.  Chest x-ray confirms pneumonia.  Labs reveal mild hyponatremia, mild hypokalemia.  Patient was ambulated with pulse ox and did well.  No indication for admission at this time, electrolytes repleted here in the emergency department, provided with first dose of prednisone, will provide burst prescription, advised to continue taking doxycycline at home.  After the discussed management above, the patient was determined to be safe for  discharge.  The patient was in agreement with this plan and all questions regarding their care were answered.  ED return precautions were discussed and the patient will return to the ED with any significant worsening of condition.  Elmer Sow. Pilar Plate, MD [facility] Emergency Medicine [facility] [email or web address]  Final Clinical Impressions(s) / ED Diagnoses     ICD-10-CM   1. Community acquired pneumonia, unspecified laterality J18.9   2. Cough R05 DG Chest 2 View    DG Chest 2 View  3. Hypokalemia E87.6  ED Discharge Orders         Ordered    cyclobenzaprine (FLEXERIL) 10 MG tablet  2 times daily PRN     05/18/18 2128    predniSONE (DELTASONE) 20 MG tablet  Daily     05/18/18 2128    ondansetron (ZOFRAN ODT) 4 MG disintegrating tablet  Every 8 hours PRN     05/18/18 2129             Sabas Sous, MD 05/18/18 2131

## 2018-05-18 NOTE — ED Triage Notes (Signed)
Patient states he has been having a productive cough with yellow/green sputum x 1 week. Patient went to [facility] walk in clinic and was told last night that he had pneumonia. Patient decided today that he needed to be seen.

## 2018-05-18 NOTE — ED Notes (Signed)
PT DISCHARGED. INSTRUCTIONS AND PRESCRIPTIONS GIVEN. AAOX4. PT IN NO APPARENT DISTRESS OR PAIN. THE OPPORTUNITY TO ASK QUESTIONS WAS PROVIDED. 

## 2018-05-18 NOTE — ED Notes (Signed)
Pt ambulated roughly 100 ft without assistance. Pt maintained O2 saturation between 94 and 97% with no complaints. HR 84 to 90 bpm

## 2021-11-30 ENCOUNTER — Other Ambulatory Visit: Payer: Self-pay | Admitting: Physician Assistant

## 2021-12-01 ENCOUNTER — Other Ambulatory Visit: Payer: Self-pay | Admitting: Physician Assistant

## 2021-12-01 DIAGNOSIS — F17200 Nicotine dependence, unspecified, uncomplicated: Secondary | ICD-10-CM

## 2021-12-06 ENCOUNTER — Ambulatory Visit
Admission: RE | Admit: 2021-12-06 | Discharge: 2021-12-06 | Disposition: A | Discharge status: Home / Self Care | Payer: Self-pay | Source: Ambulatory Visit | Attending: Physician Assistant | Admitting: Physician Assistant

## 2021-12-06 DIAGNOSIS — F17200 Nicotine dependence, unspecified, uncomplicated: Secondary | ICD-10-CM

## 2022-05-09 ENCOUNTER — Other Ambulatory Visit: Payer: Self-pay

## 2022-05-09 DIAGNOSIS — Z87891 Personal history of nicotine dependence: Secondary | ICD-10-CM

## 2022-05-09 DIAGNOSIS — F17210 Nicotine dependence, cigarettes, uncomplicated: Secondary | ICD-10-CM

## 2022-05-24 ENCOUNTER — Encounter: Payer: Medicare (Managed Care) | Admitting: Pulmonary Disease

## 2022-05-25 ENCOUNTER — Other Ambulatory Visit: Payer: Self-pay

## 2022-05-27 ENCOUNTER — Encounter: Payer: Self-pay | Admitting: Acute Care

## 2022-05-27 ENCOUNTER — Ambulatory Visit (INDEPENDENT_AMBULATORY_CARE_PROVIDER_SITE_OTHER): Payer: Medicare (Managed Care) | Admitting: Acute Care

## 2022-05-27 DIAGNOSIS — F17210 Nicotine dependence, cigarettes, uncomplicated: Secondary | ICD-10-CM

## 2022-05-27 NOTE — Progress Notes (Signed)
Virtual Visit via Telephone Note  I connected with ANTE CANSLER on 05/27/22 at 10:30 AM EST by telephone and verified that I am speaking with the correct person using two identifiers.  Location: Patient:  At home Provider:  [address], [place], [place], [address]    I discussed the limitations, risks, security and privacy concerns of performing an evaluation and management service by telephone and the availability of in person appointments. I also discussed with the patient that there may be a patient responsible charge related to this service. The patient expressed understanding and agreed to proceed.    Shared Decision Making Visit Lung Cancer Screening Program [phone])   Eligibility: Age 67 y.o. Pack Years Smoking History Calculation 76.5 pack year smoking history (# packs/per year x # years smoked) Recent History of coughing up blood  no Unexplained weight loss? no ( >Than 15 pounds within the last 6 months ) Prior History Lung / other cancer no (Diagnosis within the last 5 years already requiring surveillance chest CT Scans). Smoking Status Current Smoker Former Smokers: Years since quit:  NA  Quit Date:  NA  Visit Components: Discussion included one or more decision making aids. yes Discussion included risk/benefits of screening. yes Discussion included potential follow up diagnostic testing for abnormal scans. yes Discussion included meaning and risk of over diagnosis. yes Discussion included meaning and risk of False Positives. yes Discussion included meaning of total radiation exposure. yes  Counseling Included: Importance of adherence to annual lung cancer LDCT screening. yes Impact of comorbidities on ability to participate in the program. yes Ability and willingness to under diagnostic treatment. yes  Smoking Cessation Counseling: Current Smokers:  Discussed importance of smoking cessation. yes Information about tobacco cessation classes and  interventions provided to patient. yes Patient provided with "ticket" for LDCT Scan. yes Symptomatic Patient. no  Counseling NA Diagnosis Code: Tobacco Use Z72.0 Asymptomatic Patient yes  Counseling (Intermediate counseling: > three minutes counseling) [id number] Former Smokers:  Discussed the importance of maintaining cigarette abstinence. yes Diagnosis Code: Personal History of Nicotine Dependence. [id number] Information about tobacco cessation classes and interventions provided to patient. Yes Patient provided with "ticket" for LDCT Scan. yes Written Order for Lung Cancer Screening with LDCT placed in Epic. Yes (CT Chest Lung Cancer Screening Low Dose W/O CM) [id number] Z12.2-Screening of respiratory organs Z87.891-Personal history of nicotine dependence  I have spent 25 minutes of face to face/ virtual visit   time with  Mr. Curet discussing the risks and benefits of lung cancer screening. We viewed / discussed a power point together that explained in detail the above noted topics. We paused at intervals to allow for questions to be asked and answered to ensure understanding.We discussed that the single most powerful action that he can take to decrease his risk of developing lung cancer is to quit smoking. We discussed whether or not he is ready to commit to setting a quit date. We discussed options for tools to aid in quitting smoking including nicotine replacement therapy, non-nicotine medications, support groups, Quit Smart classes, and behavior modification. We discussed that often times setting smaller, more achievable goals, such as eliminating 1 cigarette a day for a week and then 2 cigarettes a day for a week can be helpful in slowly decreasing the number of cigarettes smoked. This allows for a sense of accomplishment as well as providing a clinical benefit. I provided  him  with smoking cessation  information  with contact information for community resources,  classes, free nicotine replacement  therapy, and access to mobile apps, text messaging, and on-line smoking cessation help. I have also provided  him  the office contact information in the event he needs to contact me, or the screening staff. We discussed the time and location of the scan, and that either Doroteo Glassman RN, Joella Prince, RN  or I will call / send a letter with the results within 24-72 hours of receiving them. The patient verbalized understanding of all of  the above and had no further questions upon leaving the office. They have my contact information in the event they have any further questions.  I spent 3 minutes counseling on smoking cessation and the health risks of continued tobacco abuse.  I explained to the patient that there has been a high incidence of coronary artery disease noted on these exams. I explained that this is a non-gated exam therefore degree or severity cannot be determined. This patient is on not statin therapy. I have asked the patient to follow-up with their PCP regarding any incidental finding of coronary artery disease and management with diet or medication as their PCP  feels is clinically indicated. The patient verbalized understanding of the above and had no further questions upon completion of the visit.      Magdalen Spatz, NP 05/27/2022

## 2022-05-27 NOTE — Patient Instructions (Signed)
Thank you for participating in the [facility] Screening Program. It was our pleasure to meet you today. We will call you with the results of your scan within the next few days. Your scan will be assigned a Lung RADS category score by the physicians reading the scans.  This Lung RADS score determines follow up scanning.  See below for description of categories, and follow up screening recommendations. We will be in touch to schedule your follow up screening annually or based on recommendations of our providers. We will fax a copy of your scan results to your Primary Care Physician, or the physician who referred you to the program, to ensure they have the results. Please call the office if you have any questions or concerns regarding your scanning experience or results.  Our office number is [phone]. Please speak with Denise Phelps, RN. , or  Denise Buckner RN, They are  our Lung Cancer Screening RN.'s If They are unavailable when you call, Please leave a message on the voice mail. We will return your call at our earliest convenience.This voice mail is monitored several times a day.  Remember, if your scan is normal, we will scan you annually as long as you continue to meet the criteria for the program. (Age 50-80, Current smoker or smoker who has quit within the last 15 years). If you are a smoker, remember, quitting is the single most powerful action that you can take to decrease your risk of lung cancer and other pulmonary, breathing related problems. We know quitting is hard, and we are here to help.  Please let us know if there is anything we can do to help you meet your goal of quitting. If you are a former smoker, congratulations. We are proud of you! Remain smoke free! Remember you can refer friends or family members through the number above.  We will screen them to make sure they meet criteria for the program. Thank you for helping us take better care of you by  participating in Lung Screening.  You can receive free nicotine replacement therapy ( patches, gum or mints) by calling 1-800-QUIT NOW. Please call so we can get you on the path to becoming  a non-smoker. I know it is hard, but you can do this!  Lung RADS Categories:  Lung RADS 1: no nodules or definitely non-concerning nodules.  Recommendation is for a repeat annual scan in 12 months.  Lung RADS 2:  nodules that are non-concerning in appearance and behavior with a very low likelihood of becoming an active cancer. Recommendation is for a repeat annual scan in 12 months.  Lung RADS 3: nodules that are probably non-concerning , includes nodules with a low likelihood of becoming an active cancer.  Recommendation is for a 6-month repeat screening scan. Often noted after an upper respiratory illness. We will be in touch to make sure you have no questions, and to schedule your 6-month scan.  Lung RADS 4 A: nodules with concerning findings, recommendation is most often for a follow up scan in 3 months or additional testing based on our provider's assessment of the scan. We will be in touch to make sure you have no questions and to schedule the recommended 3 month follow up scan.  Lung RADS 4 B:  indicates findings that are concerning. We will be in touch with you to schedule additional diagnostic testing based on our provider's  assessment of the scan.  Other options for assistance in smoking cessation (   As covered by your insurance benefits)  Hypnosis for smoking cessation  [facility] [employer] [phone]  Acupuncture for smoking cessation  [facility] [phone]   

## 2022-06-07 ENCOUNTER — Telehealth: Payer: Self-pay | Admitting: Acute Care

## 2022-06-07 ENCOUNTER — Ambulatory Visit
Admission: RE | Admit: 2022-06-07 | Discharge: 2022-06-07 | Disposition: A | Discharge status: Home / Self Care | Payer: Medicare (Managed Care) | Source: Ambulatory Visit | Attending: Acute Care | Admitting: Acute Care

## 2022-06-07 DIAGNOSIS — F17210 Nicotine dependence, cigarettes, uncomplicated: Secondary | ICD-10-CM

## 2022-06-07 DIAGNOSIS — Z87891 Personal history of nicotine dependence: Secondary | ICD-10-CM

## 2022-06-07 NOTE — Telephone Encounter (Signed)
Second encounter open by mistake. Closing this encounter since we already have one open and sent to sarah.

## 2022-06-07 NOTE — Telephone Encounter (Signed)
Called report from [facility]:  IMPRESSION: 1. Lung-RADS 4X, highly suspicious. Additional imaging evaluation or consultation with Pulmonology or Thoracic Surgery recommended. 19.7 mm spiculated nodule within the right lower and right middle lobes, centered about the right major fissure. 2. No thoracic adenopathy. 3. Aortic atherosclerosis (ICD10-I70.0), coronary artery atherosclerosis and emphysema (ICD10-J43.9). 4. Probable right nephrolithiasis.

## 2022-06-07 NOTE — Telephone Encounter (Signed)
Call report  °

## 2022-06-07 NOTE — Telephone Encounter (Signed)
[facility]. Call report

## 2022-06-09 ENCOUNTER — Telehealth: Payer: Self-pay | Admitting: Acute Care

## 2022-06-09 DIAGNOSIS — R911 Solitary pulmonary nodule: Secondary | ICD-10-CM

## 2022-06-09 NOTE — Telephone Encounter (Signed)
Results/ plans faxed to PCP.  

## 2022-06-09 NOTE — Telephone Encounter (Signed)
Thank you :)

## 2022-06-09 NOTE — Telephone Encounter (Signed)
I have called the patient with the results of his low dose Ct Chest. His scan was read as a Lung RADS 4X, highly suspicious. He has a 1.9 cm spiculated nodule is centered about the right major fissure and involves the right lower and right middle lobes. I explained that we need to take a closer look at this area. He is in agreement with this plan. I will order PET scan and I have scheduled patient to see Dr. Lamonte Sakai 06/14/2022 at 1 pm as a consult. I gave the patient the practice address, he is familiar with the location.  Langley Gauss, PET has been ordered and patient has been scheduled( Thanks for your help) 06/14/2022 at 1 pm . Please fax results to Dr. Moreen Fowler and let him know plan for follow up. Thanks so much.

## 2022-06-14 ENCOUNTER — Encounter: Payer: Self-pay | Admitting: Emergency Medicine

## 2022-06-14 ENCOUNTER — Ambulatory Visit: Payer: Medicare (Managed Care) | Admitting: Emergency Medicine

## 2022-06-14 VITALS — BP 134/78 | HR 74 | Temp 98.1°F | Ht 71.0 in | Wt 126.6 lb

## 2022-06-14 DIAGNOSIS — R911 Solitary pulmonary nodule: Secondary | ICD-10-CM

## 2022-06-14 DIAGNOSIS — J449 Chronic obstructive pulmonary disease, unspecified: Secondary | ICD-10-CM | POA: Diagnosis not present

## 2022-06-14 MED ORDER — ANORO ELLIPTA 62.5-25 MCG/ACT IN AEPB: 1.0000 | INHALATION_SPRAY | Freq: Every day | RESPIRATORY_TRACT | 1 refills | Status: DC

## 2022-06-14 NOTE — Assessment & Plan Note (Signed)
Reviewed your CT scan of the chest today. We will arrange for navigational bronchoscopy to evaluate a right-sided pulmonary nodule.  This will be done under general anesthesia as an outpatient at [facility] endoscopy.  We will try to get this arranged for 06/20/2022.  You will need a designated driver and someone to watch you at home on the date of the procedure. We will arrange for COVID-19 test We will arrange for a repeat CT scan of the chest to be done before the procedure

## 2022-06-14 NOTE — Assessment & Plan Note (Signed)
We will do a trial of Anoro once daily.  Keep track of whether this medication helps her breathing.  If so we will send a prescription to your pharmacy Keep your albuterol available to use 2 puffs if needed for shortness of breath, chest tightness, wheezing. Follow Dr. Lamonte Sakai 1 month or next available

## 2022-06-14 NOTE — H&P (View-Only) (Signed)
 Subjective:    Patient ID: Brendan Lara, male    DOB: 03/30/1955, 67 y.o.   MRN: [id number]  HPI 67-year-old man, active smoker (76 pack years), with little past medical history beyond anxiety/depression, suspected COPD.  He was referred to the lung cancer screening program and underwent a CT chest 06/07/2022 as below he is referred today to discuss that result and plan next steps in evaluation. He has a PET scheduled for 06/30/22. He reports that he has had low energy for 2 months. Some progressive exertional SOB. Lots of cough, white phlegm. No blood. Lost wt > 10 lbs in last 3 months.   Review of the chart indicates that he had had pulmonary nodular disease evaluated in 2009.  CT-PA 08/11/2007 showed emphysema, biapical scar, left lower lobe superior segmental groundglass opacity.  There was a 6 mm subpleural right lower lobe nodule, 4 mm right middle lobe nodule.  No adenopathy.  This prompted a PET scan done 08/24/2007.  On that scan the left lower lobe superior segmental groundglass opacity had resolved.  None of the other nodules had any hypermetabolism.  Lung cancer screening CT chest 06/07/2022 reviewed by me shows no mediastinal or hilar adenopathy, a spiculated nodule at the right major fissure involving the right lower and right middle lobes 1.9 cm (new compared with the scans above).  Scattered other pulmonary nodules largest 6 mm.   Review of Systems As per HPI  No past medical history on file.   Family History  Problem Relation Age of Onset   Thyroid disease Mother    Alzheimer's disease Mother    Heart failure Father      Social History   Socioeconomic History   Marital status: Single    Spouse name: Not on file   Number of children: Not on file   Years of education: Not on file   Highest education level: Not on file  Occupational History   Not on file  Tobacco Use   Smoking status: Every Day    Packs/day: 1.50    Years: 51.00    Additional pack years: 0.00     Total pack years: 76.50    Types: Cigarettes   Smokeless tobacco: Never   Tobacco comments:    1 pack of cigarettes smoked a day. ARJ 06/14/22  Vaping Use   Vaping Use: Never used  Substance and Sexual Activity   Alcohol use: Not Currently   Drug use: Yes    Types: Marijuana    Comment: once weekly   Sexual activity: Not on file  Other Topics Concern   Not on file  Social History Narrative   Not on file   Social Determinants of Health   Financial Resource Strain: Not on file  Food Insecurity: Not on file  Transportation Needs: Not on file  Physical Activity: Not on file  Stress: Not on file  Social Connections: Not on file  Intimate Partner Violence: Not on file     Allergies  Allergen Reactions   Codeine      Outpatient Medications Prior to Visit  Medication Sig Dispense Refill   Albuterol Sulfate (PROAIR RESPICLICK) 108 (90 Base) MCG/ACT AEPB 1 puff as needed Inhalation every 4 hrs for 30 days     buPROPion (WELLBUTRIN XL) 300 MG 24 hr tablet TAKE 1 TABLET BY MOUTH DAILY once a day for 90 days     acetaminophen (TYLENOL) 500 MG tablet Take 500 mg by mouth every 6 (six) hours as   needed for fever. (Patient not taking: Reported on 06/14/2022)     cyclobenzaprine (FLEXERIL) 10 MG tablet Take 1 tablet (10 mg total) by mouth 2 (two) times daily as needed for muscle spasms. (Patient not taking: Reported on 06/14/2022) 20 tablet 0   doxycycline (VIBRA-TABS) 100 MG tablet Take 100 mg by mouth 2 (two) times daily. (Patient not taking: Reported on 06/14/2022)     ondansetron (ZOFRAN ODT) 4 MG disintegrating tablet Take 1 tablet (4 mg total) by mouth every 8 (eight) hours as needed for nausea or vomiting. (Patient not taking: Reported on 06/14/2022) 20 tablet 0   No facility-administered medications prior to visit.         Objective:   Physical Exam Vitals:   06/14/22 1303  BP: 134/78  Pulse: 74  Temp: 98.1 F (36.7 C)  TempSrc: Oral  SpO2: 93%  Weight: 126 lb 9.6 oz (57.4  kg)  Height: 5' 11" (1.803 m)    Gen: Pleasant, very thin, in no distress,  normal affect  ENT: No lesions,  mouth clear,  oropharynx clear, no postnasal drip  Neck: No JVD, no stridor  Lungs: No use of accessory muscles, no crackles or wheezing on normal respiration, bilateral expiratory wheezes on forced expiration  Cardiovascular: RRR, heart sounds normal, no murmur or gallops, no peripheral edema  Musculoskeletal: No deformities, no cyanosis or clubbing  Neuro: alert, awake, non focal  Skin: Warm, no lesions or rash      Assessment & Plan:  COPD (chronic obstructive pulmonary disease) (HCC) We will do a trial of Anoro once daily.  Keep track of whether this medication helps her breathing.  If so we will send a prescription to your pharmacy Keep your albuterol available to use 2 puffs if needed for shortness of breath, chest tightness, wheezing. Follow Dr. Diana Armijo 1 month or next available  Pulmonary nodule 1 cm or greater in diameter Reviewed your CT scan of the chest today. We will arrange for navigational bronchoscopy to evaluate a right-sided pulmonary nodule.  This will be done under general anesthesia as an outpatient at  endoscopy.  We will try to get this arranged for 06/20/2022.  You will need a designated driver and someone to watch you at home on the date of the procedure. We will arrange for COVID-19 test We will arrange for a repeat CT scan of the chest to be done before the procedure   Sharonne Ricketts, MD, PhD 06/14/2022, 1:32 PM [facility] [phone] or if no answer before 7:00PM call [phone] For any issues after 7:00PM please call eLink [phone]   

## 2022-06-14 NOTE — Progress Notes (Signed)
Subjective:    Patient ID: Brendan Lara, male    DOB: Apr 11, 1955, 67 y.o.   MRN: [id number]  HPI 67 year old man, active smoker (81 pack years), with little past medical history beyond anxiety/depression, suspected COPD.  He was referred to the lung cancer screening program and underwent a CT chest 06/07/2022 as below he is referred today to discuss that result and plan next steps in evaluation. He has a PET scheduled for 06/30/22. He reports that he has had low energy for 2 months. Some progressive exertional SOB. Lots of cough, white phlegm. No blood. Lost wt > 10 lbs in last 3 months.   Review of the chart indicates that he had had pulmonary nodular disease evaluated in 2009.  CT-PA 08/11/2007 showed emphysema, biapical scar, left lower lobe superior segmental groundglass opacity.  There was a 6 mm subpleural right lower lobe nodule, 4 mm right middle lobe nodule.  No adenopathy.  This prompted a PET scan done 08/24/2007.  On that scan the left lower lobe superior segmental groundglass opacity had resolved.  None of the other nodules had any hypermetabolism.  Lung cancer screening CT chest 06/07/2022 reviewed by me shows no mediastinal or hilar adenopathy, a spiculated nodule at the right major fissure involving the right lower and right middle lobes 1.9 cm (new compared with the scans above).  Scattered other pulmonary nodules largest 6 mm.   Review of Systems As per HPI  No past medical history on file.   Family History  Problem Relation Age of Onset   Thyroid disease Mother    Alzheimer's disease Mother    Heart failure Father      Social History   Socioeconomic History   Marital status: Single    Spouse name: Not on file   Number of children: Not on file   Years of education: Not on file   Highest education level: Not on file  Occupational History   Not on file  Tobacco Use   Smoking status: Every Day    Packs/day: 1.50    Years: 51.00    Additional pack years: 0.00     Total pack years: 76.50    Types: Cigarettes   Smokeless tobacco: Never   Tobacco comments:    1 pack of cigarettes smoked a day. ARJ 06/14/22  Vaping Use   Vaping Use: Never used  Substance and Sexual Activity   Alcohol use: Not Currently   Drug use: Yes    Types: Marijuana    Comment: once weekly   Sexual activity: Not on file  Other Topics Concern   Not on file  Social History Narrative   Not on file   Social Determinants of Health   Financial Resource Strain: Not on file  Food Insecurity: Not on file  Transportation Needs: Not on file  Physical Activity: Not on file  Stress: Not on file  Social Connections: Not on file  Intimate Partner Violence: Not on file     Allergies  Allergen Reactions   Codeine      Outpatient Medications Prior to Visit  Medication Sig Dispense Refill   Albuterol Sulfate (PROAIR RESPICLICK) [id number] (90 Base) MCG/ACT AEPB 1 puff as needed Inhalation every 4 hrs for 30 days     buPROPion (WELLBUTRIN XL) 300 MG 24 hr tablet TAKE 1 TABLET BY MOUTH DAILY once a day for 90 days     acetaminophen (TYLENOL) 500 MG tablet Take 500 mg by mouth every 6 (six) hours as  needed for fever. (Patient not taking: Reported on 06/14/2022)     cyclobenzaprine (FLEXERIL) 10 MG tablet Take 1 tablet (10 mg total) by mouth 2 (two) times daily as needed for muscle spasms. (Patient not taking: Reported on 06/14/2022) 20 tablet 0   doxycycline (VIBRA-TABS) 100 MG tablet Take 100 mg by mouth 2 (two) times daily. (Patient not taking: Reported on 06/14/2022)     ondansetron (ZOFRAN ODT) 4 MG disintegrating tablet Take 1 tablet (4 mg total) by mouth every 8 (eight) hours as needed for nausea or vomiting. (Patient not taking: Reported on 06/14/2022) 20 tablet 0   No facility-administered medications prior to visit.         Objective:   Physical Exam Vitals:   06/14/22 1303  BP: 134/78  Pulse: 74  Temp: 98.1 F (36.7 C)  TempSrc: Oral  SpO2: 93%  Weight: 126 lb 9.6 oz (57.4  kg)  Height: 5\' 11"  (1.803 m)    Gen: Pleasant, very thin, in no distress,  normal affect  ENT: No lesions,  mouth clear,  oropharynx clear, no postnasal drip  Neck: No JVD, no stridor  Lungs: No use of accessory muscles, no crackles or wheezing on normal respiration, bilateral expiratory wheezes on forced expiration  Cardiovascular: RRR, heart sounds normal, no murmur or gallops, no peripheral edema  Musculoskeletal: No deformities, no cyanosis or clubbing  Neuro: alert, awake, non focal  Skin: Warm, no lesions or rash      Assessment & Plan:  COPD (chronic obstructive pulmonary disease) (HCC) We will do a trial of Anoro once daily.  Keep track of whether this medication helps her breathing.  If so we will send a prescription to your pharmacy Keep your albuterol available to use 2 puffs if needed for shortness of breath, chest tightness, wheezing. Follow Dr. Lamonte Sakai 1 month or next available  Pulmonary nodule 1 cm or greater in diameter Reviewed your CT scan of the chest today. We will arrange for navigational bronchoscopy to evaluate a right-sided pulmonary nodule.  This will be done under general anesthesia as an outpatient at [facility] endoscopy.  We will try to get this arranged for 06/20/2022.  You will need a designated driver and someone to watch you at home on the date of the procedure. We will arrange for COVID-19 test We will arrange for a repeat CT scan of the chest to be done before the procedure   Baltazar Apo, MD, PhD 06/14/2022, 1:32 PM [facility] [phone] or if no answer before 7:00PM call [phone] For any issues after 7:00PM please call eLink [phone]

## 2022-06-14 NOTE — Patient Instructions (Addendum)
Reviewed your CT scan of the chest today. We will arrange for navigational bronchoscopy to evaluate a right-sided pulmonary nodule.  This will be done under general anesthesia as an outpatient at [facility] endoscopy.  We will try to get this arranged for 06/20/2022.  You will need a designated driver and someone to watch you at home on the date of the procedure. We will arrange for COVID-19 test We will arrange for a repeat CT scan of the chest to be done before the procedure We will do a trial of Anoro once daily.  Keep track of whether this medication helps her breathing.  If so we will send a prescription to your pharmacy Keep your albuterol available to use 2 puffs if needed for shortness of breath, chest tightness, wheezing. Follow Dr. Lamonte Sakai 1 month or next available

## 2022-06-15 ENCOUNTER — Encounter (HOSPITAL_COMMUNITY): Payer: Self-pay | Admitting: Emergency Medicine

## 2022-06-15 ENCOUNTER — Other Ambulatory Visit: Payer: Self-pay

## 2022-06-15 NOTE — Progress Notes (Addendum)
SDW call  PCP - Dr. Ferne Reus, [facility] Cardiologist - Denies Pulmonary: Dr. Lamonte Sakai   PPM/ICD - Denies   Chest x-ray - 05/18/2018 EKG -  05/19/2018 Stress Test - ECHO -  Cardiac Cath -   Sleep Study/sleep apnea/CPAP: Denies  Non-diabetic   Blood Thinner Instructions: Denies Aspirin Instructions:Denies   ERAS Protcol - No, NPO PRE-SURGERY Ensure or G2- No   COVID TEST- Scheduled to have covid test at [facility] 05/21/2022     Anesthesia review: No   Patient denies shortness of breath, fever, cough and chest pain over the phone call    Your procedure is scheduled on Monday June 20, 2022  Report to [place] Entr[facility] "A" at [place] then check in with the Admitting office.  Call this number if you have problems the morning of surgery:  [phone]   If you have any questions prior to your surgery date call [phone]: Open Monday-Friday 8am-4pm If you experience any cold or flu symptoms such as cough, fever, chills, shortness of breath, etc. between now and your scheduled surgery, please notify us at the above number     Remember:  Do not eat or drink after midnight the night before your surgery   Take these medicines the morning of surgery with A SIP OF WATER:  Wellbutrin  As needed: Anoro Ellipta  As of today, STOP taking any Aspirin (unless otherwise instructed by your surgeon) Aleve, Naproxen, Ibuprofen, Motrin, Advil, Goody's, BC's, all herbal medications, fish oil, and all vitamins.

## 2022-06-16 ENCOUNTER — Other Ambulatory Visit: Payer: Medicare (Managed Care)

## 2022-06-16 DIAGNOSIS — R911 Solitary pulmonary nodule: Secondary | ICD-10-CM

## 2022-06-17 ENCOUNTER — Ambulatory Visit
Admission: RE | Admit: 2022-06-17 | Discharge: 2022-06-17 | Disposition: A | Discharge status: Home / Self Care | Payer: Medicare (Managed Care) | Source: Ambulatory Visit | Attending: Emergency Medicine | Admitting: Emergency Medicine

## 2022-06-17 DIAGNOSIS — R911 Solitary pulmonary nodule: Secondary | ICD-10-CM

## 2022-06-18 LAB — NOVEL CORONAVIRUS, NAA: SARS-CoV-2, NAA: NOT DETECTED

## 2022-06-20 ENCOUNTER — Ambulatory Visit (HOSPITAL_COMMUNITY): Payer: Medicare (Managed Care) | Admitting: Anesthesiology

## 2022-06-20 ENCOUNTER — Ambulatory Visit (HOSPITAL_COMMUNITY): Payer: Medicare (Managed Care)

## 2022-06-20 ENCOUNTER — Encounter (HOSPITAL_COMMUNITY): Payer: Self-pay | Admitting: Emergency Medicine

## 2022-06-20 ENCOUNTER — Ambulatory Visit (HOSPITAL_BASED_OUTPATIENT_CLINIC_OR_DEPARTMENT_OTHER): Payer: Medicare (Managed Care) | Admitting: Anesthesiology

## 2022-06-20 ENCOUNTER — Encounter (HOSPITAL_COMMUNITY)
Admission: RE | Disposition: A | Discharge status: Home / Self Care | Payer: Self-pay | Source: Ambulatory Visit | Attending: Emergency Medicine

## 2022-06-20 ENCOUNTER — Ambulatory Visit (HOSPITAL_COMMUNITY)
Admission: RE | Admit: 2022-06-20 | Discharge: 2022-06-20 | Disposition: A | Discharge status: Home / Self Care | Payer: Medicare (Managed Care) | Source: Ambulatory Visit | Attending: Emergency Medicine | Admitting: Emergency Medicine

## 2022-06-20 DIAGNOSIS — R911 Solitary pulmonary nodule: Secondary | ICD-10-CM | POA: Diagnosis not present

## 2022-06-20 DIAGNOSIS — J449 Chronic obstructive pulmonary disease, unspecified: Secondary | ICD-10-CM | POA: Insufficient documentation

## 2022-06-20 DIAGNOSIS — F419 Anxiety disorder, unspecified: Secondary | ICD-10-CM | POA: Diagnosis not present

## 2022-06-20 DIAGNOSIS — F32A Depression, unspecified: Secondary | ICD-10-CM | POA: Diagnosis not present

## 2022-06-20 DIAGNOSIS — R918 Other nonspecific abnormal finding of lung field: Secondary | ICD-10-CM | POA: Diagnosis not present

## 2022-06-20 DIAGNOSIS — F17210 Nicotine dependence, cigarettes, uncomplicated: Secondary | ICD-10-CM | POA: Insufficient documentation

## 2022-06-20 HISTORY — DX: Nausea with vomiting, unspecified: Z98.890

## 2022-06-20 HISTORY — PX: VIDEO BRONCHOSCOPY WITH RADIAL ENDOBRONCHIAL ULTRASOUND: SHX6849

## 2022-06-20 HISTORY — DX: Other specified postprocedural states: R11.2

## 2022-06-20 HISTORY — DX: Dyspnea, unspecified: R06.00

## 2022-06-20 HISTORY — PX: HEMOSTASIS CONTROL: SHX6838

## 2022-06-20 HISTORY — PX: BRONCHIAL BRUSHINGS: SHX5108

## 2022-06-20 HISTORY — PX: BRONCHIAL NEEDLE ASPIRATION BIOPSY: SHX5106

## 2022-06-20 HISTORY — DX: Unspecified osteoarthritis, unspecified site: M19.90

## 2022-06-20 HISTORY — PX: BRONCHIAL BIOPSY: SHX5109

## 2022-06-20 HISTORY — DX: Other complications of anesthesia, initial encounter: T88.59XA

## 2022-06-20 HISTORY — DX: Depression, unspecified: F32.A

## 2022-06-20 LAB — CBC
HCT: 52.5 % — ABNORMAL HIGH (ref 39.0–52.0)
Hemoglobin: 17.2 g/dL — ABNORMAL HIGH (ref 13.0–17.0)
MCH: 31.7 pg (ref 26.0–34.0)
MCHC: 32.8 g/dL (ref 30.0–36.0)
MCV: 96.7 fL (ref 80.0–100.0)
Platelets: 149 10*3/uL — ABNORMAL LOW (ref 150–400)
RBC: 5.43 MIL/uL (ref 4.22–5.81)
RDW: 13.2 % (ref 11.5–15.5)
WBC: 6.3 10*3/uL (ref 4.0–10.5)
nRBC: 0 % (ref 0.0–0.2)

## 2022-06-20 SURGERY — BRONCHOSCOPY, WITH BIOPSY USING ELECTROMAGNETIC NAVIGATION
Anesthesia: General | Laterality: Right

## 2022-06-20 MED ORDER — SUGAMMADEX SODIUM 200 MG/2ML IV SOLN
INTRAVENOUS | Status: DC | PRN
  Administered 2022-06-20: 200 mg via INTRAVENOUS

## 2022-06-20 MED ORDER — MEPERIDINE HCL 25 MG/ML IJ SOLN: 6.2500 mg | INTRAMUSCULAR | Status: DC | PRN

## 2022-06-20 MED ORDER — KETOROLAC TROMETHAMINE 30 MG/ML IJ SOLN: 30.0000 mg | Freq: Once | INTRAMUSCULAR | Status: DC | PRN

## 2022-06-20 MED ORDER — FENTANYL CITRATE (PF) 100 MCG/2ML IJ SOLN
INTRAMUSCULAR | Status: AC
  Filled 2022-06-20: qty 2

## 2022-06-20 MED ORDER — FENTANYL CITRATE (PF) 100 MCG/2ML IJ SOLN: 25.0000 ug | INTRAMUSCULAR | Status: DC | PRN

## 2022-06-20 MED ORDER — PROPOFOL 10 MG/ML IV BOLUS
INTRAVENOUS | Status: DC | PRN
  Administered 2022-06-20: 100 mg via INTRAVENOUS

## 2022-06-20 MED ORDER — LIDOCAINE 2% (20 MG/ML) 5 ML SYRINGE
INTRAMUSCULAR | Status: DC | PRN
  Administered 2022-06-20: 60 mg via INTRAVENOUS

## 2022-06-20 MED ORDER — PHENYLEPHRINE 80 MCG/ML (10ML) SYRINGE FOR IV PUSH (FOR BLOOD PRESSURE SUPPORT)
PREFILLED_SYRINGE | INTRAVENOUS | Status: DC | PRN
  Administered 2022-06-20 (×7): 80 ug via INTRAVENOUS

## 2022-06-20 MED ORDER — DEXAMETHASONE SODIUM PHOSPHATE 10 MG/ML IJ SOLN
INTRAMUSCULAR | Status: DC | PRN
  Administered 2022-06-20: 10 mg via INTRAVENOUS

## 2022-06-20 MED ORDER — ONDANSETRON HCL 4 MG/2ML IJ SOLN: 4.0000 mg | Freq: Once | INTRAMUSCULAR | Status: DC | PRN

## 2022-06-20 MED ORDER — LACTATED RINGERS IV SOLN: INTRAVENOUS | Status: DC

## 2022-06-20 MED ORDER — ONDANSETRON HCL 4 MG/2ML IJ SOLN
INTRAMUSCULAR | Status: DC | PRN
  Administered 2022-06-20: 4 mg via INTRAVENOUS

## 2022-06-20 MED ORDER — OXYCODONE HCL 5 MG/5ML PO SOLN: 5.0000 mg | Freq: Once | ORAL | Status: DC | PRN

## 2022-06-20 MED ORDER — SODIUM CHLORIDE (PF) 0.9 % IJ SOLN
PREFILLED_SYRINGE | INTRAMUSCULAR | Status: DC | PRN
  Administered 2022-06-20: 4 mL

## 2022-06-20 MED ORDER — ROCURONIUM BROMIDE 10 MG/ML (PF) SYRINGE
PREFILLED_SYRINGE | INTRAVENOUS | Status: DC | PRN
  Administered 2022-06-20: 40 mg via INTRAVENOUS
  Administered 2022-06-20: 20 mg via INTRAVENOUS

## 2022-06-20 MED ORDER — ACETAMINOPHEN 325 MG PO TABS: 325.0000 mg | ORAL_TABLET | ORAL | Status: DC | PRN

## 2022-06-20 MED ORDER — OXYCODONE HCL 5 MG PO TABS: 5.0000 mg | ORAL_TABLET | Freq: Once | ORAL | Status: DC | PRN

## 2022-06-20 MED ORDER — CHLORHEXIDINE GLUCONATE 0.12 % MT SOLN
OROMUCOSAL | Status: AC
  Filled 2022-06-20: qty 15

## 2022-06-20 MED ORDER — ACETAMINOPHEN 160 MG/5ML PO SOLN: 325.0000 mg | ORAL | Status: DC | PRN

## 2022-06-20 MED ORDER — FENTANYL CITRATE (PF) 250 MCG/5ML IJ SOLN
INTRAMUSCULAR | Status: DC | PRN
  Administered 2022-06-20: 100 ug via INTRAVENOUS

## 2022-06-20 NOTE — Anesthesia Postprocedure Evaluation (Signed)
Anesthesia Post Note  Patient: Brendan Lara  Procedure(s) Performed: ROBOTIC ASSISTED NAVIGATIONAL BRONCHOSCOPY (Right) VIDEO BRONCHOSCOPY WITH RADIAL ENDOBRONCHIAL ULTRASOUND BRONCHIAL BIOPSIES BRONCHIAL BRUSHINGS BRONCHIAL NEEDLE ASPIRATION BIOPSIES HEMOSTASIS CONTROL     Patient location during evaluation: PACU Anesthesia Type: General Level of consciousness: awake Pain management: pain level controlled Vital Signs Assessment: post-procedure vital signs reviewed and stable Respiratory status: spontaneous breathing, nonlabored ventilation and respiratory function stable Cardiovascular status: blood pressure returned to baseline and stable Postop Assessment: no apparent nausea or vomiting Anesthetic complications: no   No notable events documented.  Last Vitals:  Vitals:   06/20/22 1045 06/20/22 1100  BP: 125/83 (!) 144/78  Pulse: 80 86  Resp: 14 (!) 21  Temp:  36.7 C  SpO2: 92% 94%    Last Pain:  Vitals:   06/20/22 1100  TempSrc:   PainSc: 0-No pain                 Ranata Laughery P Angela Platner

## 2022-06-20 NOTE — Op Note (Signed)
Video Bronchoscopy with Robotic Assisted Bronchoscopic Navigation   Date of Operation: 06/20/2022   Pre-op Diagnosis: Right lung mass, involving the right lower lobe and right middle lobe  Post-op Diagnosis: Right lower lobe/middle lobe mass, right middle lobe endobronchial mass  Surgeon: Baltazar Apo  Assistants: None  Anesthesia: General endotracheal anesthesia  Operation: Flexible video fiberoptic bronchoscopy with robotic assistance and biopsies.  Estimated Blood Loss: Minimal  Complications: None  Indications and History: Brendan Lara is a 67 y.o. male with history of tobacco use.  He was found to have a right lung mass that abutted the major fissure between the right lower lobe and the posterior right middle lobe.  Recommendation made to achieve tissue diagnosis with robotic assisted navigational bronchoscopy. The risks, benefits, complications, treatment options and expected outcomes were discussed with the patient.  The possibilities of pneumothorax, pneumonia, reaction to medication, pulmonary aspiration, perforation of a viscus, bleeding, failure to diagnose a condition and creating a complication requiring transfusion or operation were discussed with the patient who freely signed the consent.    Description of Procedure: The patient was seen in the Preoperative Area, was examined and was deemed appropriate to proceed.  The patient was taken to [facility] endoscopy room 3, identified as Brendan Lara and the procedure verified as Flexible Video Fiberoptic Bronchoscopy.  A Time Out was held and the above information confirmed.   Prior to the date of the procedure a high-resolution CT scan of the chest was performed. Utilizing ION software program a virtual tracheobronchial tree was generated to allow the creation of distinct navigation pathways to the patient's parenchymal abnormalities. After being taken to the operating room general anesthesia was initiated and the patient  was  orally intubated. The video fiberoptic bronchoscope was introduced via the endotracheal tube and a general inspection was performed which showed normal airways with the exception of a mass on the right middle lobe anterior segmental carina.  Endobronchial biopsies were performed at this lesion and sent for pathology.  Secretions were suctioned.  Robotic catheter inserted into patient's endotracheal tube.   Target #1 right lower lobe/middle lobe mass: The distinct navigation pathways prepared prior to this procedure were then utilized to navigate to patient's lesion identified on CT scan. The robotic catheter was secured into place and the vision probe was withdrawn.  Lesion location was approximated using fluoroscopy and radial endobronchial ultrasound for peripheral targeting. Under fluoroscopic guidance transbronchial needle brushings, transbronchial needle biopsies, and transbronchial forceps biopsies were performed to be sent for cytology and pathology.   At the end of the procedure a general airway inspection was performed and there was no evidence of active bleeding. The bronchoscope was removed.  The patient tolerated the procedure well. There was no significant blood loss and there were no obvious complications. A post-procedural chest x-ray is pending.  Samples Target #1: 1. Transbronchial needle brushings from right lower lobe/middle lobe mass 2. Transbronchial Wang needle biopsies from right lower lobe/middle lobe mass 3. Transbronchial forceps biopsies from right lower lobe/middle lobe mass  Endobronchial samples: 1. Endobronchial biopsies from right middle lobe anterior segmental carina for pathology  Plans:  The patient will be discharged from the PACU to home when recovered from anesthesia and after chest x-ray is reviewed. We will review the cytology, pathology results with the patient when they become available. Outpatient followup will be with Dr. Lamonte Sakai.    Baltazar Apo, MD,  PhD 06/20/2022, 10:10 AM [facility] [phone] or if no  answer before 7:00PM call [phone] For any issues after 7:00PM please call eLink [phone]

## 2022-06-20 NOTE — Interval H&P Note (Signed)
History and Physical Interval Note:  06/20/2022 7:27 AM  Brendan Lara  has presented today for surgery, with the diagnosis of RIGHT LOWER LOPE MASS.  The various methods of treatment have been discussed with the patient and family. After consideration of risks, benefits and other options for treatment, the patient has consented to  Procedure(s): ROBOTIC ASSISTED NAVIGATIONAL BRONCHOSCOPY (Right) as a surgical intervention.  The patient's history has been reviewed, patient examined, no change in status, stable for surgery.  I have reviewed the patient's chart and labs.  Questions were answered to the patient's satisfaction.     Collene Gobble

## 2022-06-20 NOTE — Anesthesia Procedure Notes (Signed)
Procedure Name: Intubation Date/Time: 06/20/2022 9:21 AM  Performed by: Reeves Dam, CRNAPre-anesthesia Checklist: Patient identified, Patient being monitored, Timeout performed, Emergency Drugs available and Suction available Patient Re-evaluated:Patient Re-evaluated prior to induction Oxygen Delivery Method: Circle system utilized Preoxygenation: Pre-oxygenation with 100% oxygen Induction Type: IV induction Ventilation: Mask ventilation without difficulty Laryngoscope Size: 3 and Miller Grade View: Grade I Tube type: Oral Tube size: 7.0 mm Number of attempts: 1 Airway Equipment and Method: Stylet Placement Confirmation: ETT inserted through vocal cords under direct vision, positive ETCO2 and breath sounds checked- equal and bilateral Secured at: 21 cm Tube secured with: Tape Dental Injury: Teeth and Oropharynx as per pre-operative assessment

## 2022-06-20 NOTE — Discharge Instructions (Signed)
Flexible Bronchoscopy, Care After This sheet gives you information about how to care for yourself after your test. Your doctor may also give you more specific instructions. If you have problems or questions, contact your doctor. Follow these instructions at home: Eating and drinking When your numbness is gone and your cough and gag reflexes have come back, you may: Eat only soft foods. Slowly drink liquids. The day after the test, go back to your normal diet. Driving Do not drive for 24 hours if you were given a medicine to help you relax (sedative). Do not drive or use heavy machinery while taking prescription pain medicine. General instructions  Take over-the-counter and prescription medicines only as told by your doctor. Return to your normal activities as told. Ask what activities are safe for you. Do not use any products that have nicotine or tobacco in them. This includes cigarettes and e-cigarettes. If you need help quitting, ask your doctor. Keep all follow-up visits as told by your doctor. This is important. It is very important if you had a tissue sample (biopsy) taken. Get help right away if: You have shortness of breath that gets worse. You get light-headed. You feel like you are going to pass out (faint). You have chest pain. You cough up: More than a little blood. More blood than before. Summary Do not eat or drink anything (not even water) for 2 hours after your test, or until your numbing medicine wears off. Do not use cigarettes. Do not use e-cigarettes. Get help right away if you have chest pain.  Please call our office for any questions or concerns.  [phone].  This information is not intended to replace advice given to you by your health care provider. Make sure you discuss any questions you have with your health care provider. Document Released: 01/02/2009 Document Revised: 02/17/2017 Document Reviewed: 03/25/2016 Elsevier Patient Education  2020 Elsevier  Inc.  

## 2022-06-20 NOTE — Anesthesia Preprocedure Evaluation (Addendum)
Anesthesia Evaluation  Patient identified by MRN, date of birth, ID band Patient awake    Reviewed: Allergy & Precautions, NPO status , Patient's Chart, lab work & pertinent test results  History of Anesthesia Complications (+) PONV and history of anesthetic complications  Airway Mallampati: I       Dental  (+) Upper Dentures, Edentulous Lower   Pulmonary Current Smoker and Patient abstained from smoking.   Pulmonary exam normal        Cardiovascular negative cardio ROS Normal cardiovascular exam     Neuro/Psych  PSYCHIATRIC DISORDERS  Depression       GI/Hepatic Neg liver ROS,,,  Endo/Other    Renal/GU negative Renal ROS  negative genitourinary   Musculoskeletal   Abdominal Normal abdominal exam  (+)   Peds  Hematology negative hematology ROS (+)   Anesthesia Other Findings   Reproductive/Obstetrics                             Anesthesia Physical Anesthesia Plan  ASA: 2  Anesthesia Plan: General   Post-op Pain Management:    Induction: Intravenous  PONV Risk Score and Plan: Ondansetron, Midazolam and Dexamethasone  Airway Management Planned: Oral ETT  Additional Equipment: None  Intra-op Plan:   Post-operative Plan: Extubation in OR  Informed Consent: I have reviewed the patients History and Physical, chart, labs and discussed the procedure including the risks, benefits and alternatives for the proposed anesthesia with the patient or authorized representative who has indicated his/her understanding and acceptance.     Dental advisory given  Plan Discussed with: CRNA  Anesthesia Plan Comments:        Anesthesia Quick Evaluation

## 2022-06-20 NOTE — Transfer of Care (Signed)
Immediate Anesthesia Transfer of Care Note  Patient: Brendan Lara  Procedure(s) Performed: ROBOTIC ASSISTED NAVIGATIONAL BRONCHOSCOPY (Right) VIDEO BRONCHOSCOPY WITH RADIAL ENDOBRONCHIAL ULTRASOUND BRONCHIAL BIOPSIES BRONCHIAL BRUSHINGS BRONCHIAL NEEDLE ASPIRATION BIOPSIES HEMOSTASIS CONTROL  Patient Location: PACU  Anesthesia Type:General  Level of Consciousness: awake, alert , and oriented  Airway & Oxygen Therapy: Patient Spontanous Breathing and Patient connected to face mask oxygen  Post-op Assessment: Report given to RN and Post -op Vital signs reviewed and stable  Post vital signs: Reviewed and stable  Last Vitals:  Vitals Value Taken Time  BP 117/81 06/20/22 1018  Temp 37.1 C 06/20/22 1018  Pulse 84 06/20/22 1026  Resp 11 06/20/22 1026  SpO2 96 % 06/20/22 1026  Vitals shown include unvalidated device data.  Last Pain:  Vitals:   06/20/22 1018  TempSrc:   PainSc: Asleep         Complications: No notable events documented.

## 2022-06-21 ENCOUNTER — Encounter (HOSPITAL_COMMUNITY): Payer: Self-pay | Admitting: Emergency Medicine

## 2022-06-22 ENCOUNTER — Telehealth: Payer: Self-pay | Admitting: Emergency Medicine

## 2022-06-22 DIAGNOSIS — C3491 Malignant neoplasm of unspecified part of right bronchus or lung: Secondary | ICD-10-CM

## 2022-06-22 NOTE — Telephone Encounter (Signed)
Reviewed bronchoscopy results with Dr. Saralyn Pilar on 4/2.  Reviewed today with the patient.  His right midlung mass showed adenocarcinoma.  The right middle lobe endobronchial lesion showed squamous metaplasia but was negative for malignancy.  I will make a referral for the patient to see thoracic oncology.  He has a PET scan ordered for next week

## 2022-06-23 LAB — CYTOLOGY - NON PAP

## 2022-06-23 LAB — SURGICAL PATHOLOGY

## 2022-06-24 ENCOUNTER — Other Ambulatory Visit: Payer: Self-pay

## 2022-06-24 DIAGNOSIS — R911 Solitary pulmonary nodule: Secondary | ICD-10-CM

## 2022-06-30 ENCOUNTER — Ambulatory Visit (HOSPITAL_COMMUNITY)
Admission: RE | Admit: 2022-06-30 | Discharge: 2022-06-30 | Disposition: A | Discharge status: Home / Self Care | Payer: Medicare (Managed Care) | Source: Ambulatory Visit | Attending: Acute Care | Admitting: Acute Care

## 2022-06-30 ENCOUNTER — Other Ambulatory Visit: Payer: Self-pay | Admitting: *Deleted

## 2022-06-30 DIAGNOSIS — R911 Solitary pulmonary nodule: Secondary | ICD-10-CM

## 2022-06-30 LAB — GLUCOSE, CAPILLARY: Glucose-Capillary: 104 mg/dL — ABNORMAL HIGH (ref 70–99)

## 2022-06-30 MED ORDER — FLUDEOXYGLUCOSE F - 18 (FDG) INJECTION
6.3000 | Freq: Once | INTRAVENOUS | Status: AC | PRN
  Administered 2022-06-30: 6.3 via INTRAVENOUS

## 2022-06-30 NOTE — Research (Signed)
Title: A multi-center, prospective, single-arm, observational study to evaluate real-world outcomes for the shape-sensing Ion endoluminal system  Primary Outcome: Evaluate procedure characteristics and short and long-term patient outcomes following shape-sensing robotic-assisted bronchoscopy (ssRAB) utilizing the Ion Endoluminal System for lung lesion localization or biopsy.   Protocol # / Study Name: ISI-ION-003 Clinical Trials #: [id number] Sponsor: [employer] Principal Investigator: Dr. Elige Radon Icard  Key Features of Ion Endoluminal System (referred to as "Ion") Ion is the first FDA cleared bronchoscopy system that uses fiber optic shape sensing technology to inform on location within the airways. Its catheter/tool channel has a smaller outer diameter (3.5 mm) in comparison to conventional bronchoscopes, allowing it to navigate into the smaller airways of the periphery.     Key Inclusion Criteria Subject is 18 years or older at the time of the procedure Subject is a candidate for a planned, elective RAB lung lesion localization or biopsy procedure in which the Ion Endoluminal System is planned to be utilized.  Subject  able to understand and adhere to study requirements and provide informed consent.   Key Exclusion Criteria Subject is under the care of a Museum/gallery exhibitions officer and is unable to provide informed consent on their own accord.  Subject is participating in an interventional research study or research study with investigational agents with an unknown safety profile that would interfere with participation or the results of this study.  Male subjects who are pregnant or nursing at the time of the index Ion procedure, as determined by standard site practices. Subjects that are incarcerated or institutionalized under court order, or other vulnerable populations.    Previous Clinical Trials Since receiving FDA clearance in Feb 2019, Ion has been adopted  commercially by over 226 centers in the [place], and utilized in over 40,000 procedures.  The first in-human study enrolled 27 subjects with a mean lesion size of 14.8 mm and the overall diagnostic yield was 79.3%, with no adverse events. 17 (58.6%) lesions were reported to have a bronchus sign available on CT imaging.  A multi-center study published results in 2022, with 270 lesions biopsied in 241 patients using Ion. The mean largest cardinal lesion size was 18.86.60mm, and the mean airway generation count was 7.01.6. Asymptomatic pneumothorax occurred in 3.3% of subjects, and 0.8% experienced airway bleeding.   Another study provided preliminary results in 2022, with 87% sensitivity for malignancy, a diagnostic yield of 81%, and a mean lesion size of 16 mm. 75% of biopsy cases were bronchus-sign negative. 4% of subjects experienced pneumothoraces (including those requiring intervention), and 0.8% of subjects experienced airway bleeding requiring wedging or balloon tamponade.  A single-center study captured 131 consecutive procedures of pulmonary biopsy using Ion. The navigational success rate was 98.7%, with an overall diagnostic yield of 81.7%, an overall complication rate of 3%, and a pneumothorax rate of 1.5%.   [facility] [place] Clinical Research Coordinator note:   This visit for Subject Brendan Lara with DOB: 1955/10/07 on 06/30/2022 for the above protocol is Visit/Encounter # Pre-procedure, Intra-procedure, and Post-procedure  and is for purpose of research.   The consent for this encounter is under:  Protocol Version 1.0 Investigator Brochure Version N/A Consent Version Revision A, dated 14Nov2023 and is currently IRB approved.   Brendan Lara expressed continued interest and consent in continuing as a study subject. Subject confirmed that there was no change in contact information (e.g. address, telephone, email). Subject thanked for participation in research and contribution  to science. In  this visit 06/30/2022 the subject will be evaluated by Sub-Investigator named Dr. Delton Coombes. This research coordinator has verified that the above investigator is up to date with his/her training logs.   The Subject was informed that the PI continues to have oversight of the subject's visits and course through relevant discussions, reviews, and also specifically of this visit by routing of this note to the PI.  The research study was discussed with the subject in the pre-operative room. The study was explained in detail including all the contents of the informed consent document. The subject was encouraged to ask questions. All questions were answered to their satisfaction. The IRB approved informed consent was signed, and a copy was given to the subject. After obtaining consent, the subject underwent scheduled procedure using the ion endoluminal system. Data collection was completed per protocol. Refer to paper source subject binder for further details.      Signed by  Verdene Lennert Clinical Research Coordinator / Sub-Investigator  [facility]  [place], [place] 9:53 AM 06/30/2022

## 2022-06-30 NOTE — Progress Notes (Signed)
The proposed treatment discussed in conference is for discussion purpose only and is not a binding recommendation.  The patients have not been physically examined, or presented with their treatment options.  Therefore, final treatment plans cannot be decided.  

## 2022-07-01 NOTE — Progress Notes (Signed)
Navigator was able to reach patient to make self introducation and explain the role of a nurse navigator. Navigator verified that pt is aware of date and time of his appointments. 11:15 for lab and 11:45 for Dr.Mohamed. Navigator verified that the pt is aware of the location of the Cancer Center. Pt states he does not plan on having a support person with him at the appointment. Navigator encouraged the pt to bring something to write with in the event he develops any questions during the appointment. Pt denies additional questions or concerns at this time. Navigator will meet with the pt at his consult on 4/15.

## 2022-07-04 ENCOUNTER — Other Ambulatory Visit: Payer: Self-pay

## 2022-07-04 ENCOUNTER — Inpatient Hospital Stay: Payer: Medicare (Managed Care) | Attending: Internal Medicine

## 2022-07-04 ENCOUNTER — Inpatient Hospital Stay (HOSPITAL_BASED_OUTPATIENT_CLINIC_OR_DEPARTMENT_OTHER): Payer: Medicare (Managed Care) | Admitting: Internal Medicine

## 2022-07-04 VITALS — BP 120/72 | HR 70 | Temp 98.1°F | Resp 16 | Wt 127.1 lb

## 2022-07-04 DIAGNOSIS — R911 Solitary pulmonary nodule: Secondary | ICD-10-CM

## 2022-07-04 DIAGNOSIS — F32A Depression, unspecified: Secondary | ICD-10-CM | POA: Insufficient documentation

## 2022-07-04 DIAGNOSIS — M1990 Unspecified osteoarthritis, unspecified site: Secondary | ICD-10-CM | POA: Insufficient documentation

## 2022-07-04 DIAGNOSIS — C3412 Malignant neoplasm of upper lobe, left bronchus or lung: Secondary | ICD-10-CM | POA: Diagnosis not present

## 2022-07-04 DIAGNOSIS — Z79899 Other long term (current) drug therapy: Secondary | ICD-10-CM | POA: Diagnosis not present

## 2022-07-04 DIAGNOSIS — C3491 Malignant neoplasm of unspecified part of right bronchus or lung: Secondary | ICD-10-CM

## 2022-07-04 DIAGNOSIS — C3490 Malignant neoplasm of unspecified part of unspecified bronchus or lung: Secondary | ICD-10-CM

## 2022-07-04 DIAGNOSIS — J432 Centrilobular emphysema: Secondary | ICD-10-CM | POA: Insufficient documentation

## 2022-07-04 DIAGNOSIS — F17210 Nicotine dependence, cigarettes, uncomplicated: Secondary | ICD-10-CM | POA: Insufficient documentation

## 2022-07-04 LAB — CBC WITH DIFFERENTIAL (CANCER CENTER ONLY)
Abs Immature Granulocytes: 0.02 10*3/uL (ref 0.00–0.07)
Basophils Absolute: 0.1 10*3/uL (ref 0.0–0.1)
Basophils Relative: 1 %
Eosinophils Absolute: 0.1 10*3/uL (ref 0.0–0.5)
Eosinophils Relative: 2 %
HCT: 50.1 % (ref 39.0–52.0)
Hemoglobin: 17.4 g/dL — ABNORMAL HIGH (ref 13.0–17.0)
Immature Granulocytes: 0 %
Lymphocytes Relative: 28 %
Lymphs Abs: 1.9 10*3/uL (ref 0.7–4.0)
MCH: 32.3 pg (ref 26.0–34.0)
MCHC: 34.7 g/dL (ref 30.0–36.0)
MCV: 92.9 fL (ref 80.0–100.0)
Monocytes Absolute: 0.5 10*3/uL (ref 0.1–1.0)
Monocytes Relative: 7 %
Neutro Abs: 4.3 10*3/uL (ref 1.7–7.7)
Neutrophils Relative %: 62 %
Platelet Count: 176 10*3/uL (ref 150–400)
RBC: 5.39 MIL/uL (ref 4.22–5.81)
RDW: 13.1 % (ref 11.5–15.5)
WBC Count: 6.9 10*3/uL (ref 4.0–10.5)
nRBC: 0 % (ref 0.0–0.2)

## 2022-07-04 LAB — CMP (CANCER CENTER ONLY)
ALT: 13 U/L (ref 0–44)
AST: 15 U/L (ref 15–41)
Albumin: 4.1 g/dL (ref 3.5–5.0)
Alkaline Phosphatase: 63 U/L (ref 38–126)
Anion gap: 5 (ref 5–15)
BUN: 16 mg/dL (ref 8–23)
CO2: 29 mmol/L (ref 22–32)
Calcium: 9.5 mg/dL (ref 8.9–10.3)
Chloride: 103 mmol/L (ref 98–111)
Creatinine: 1 mg/dL (ref 0.61–1.24)
GFR, Estimated: >60 mL/min (ref >60)
Glucose, Bld: 105 mg/dL — ABNORMAL HIGH (ref 70–99)
Potassium: 4.4 mmol/L (ref 3.5–5.1)
Sodium: 137 mmol/L (ref 135–145)
Total Bilirubin: 0.5 mg/dL (ref 0.3–1.2)
Total Protein: 7 g/dL (ref 6.5–8.1)

## 2022-07-04 NOTE — Progress Notes (Signed)
NN met with patient face to face today at his consult appointment with Dr Arbutus Ped. Pt was not accompanied by a support person. Patient was discuss at Tumor Board on 4/11 and the recommendation was for the patient to go to surgery.  A referral for surgery was placed by Dr Arbutus Ped. To complete the patient's work up, a brain MRI will also need to be completed. NN has ordered PFTs for the patient and reached out to RTs at [facility] to assist with scheduling. Pt's immediate concern is his finances. Pt is retired and his only source of income is his SS. Patient states he is making ends meet at the moment, but is concerned what may happen as things progress. NN let the patient know there are resources that may be able to help him if he is interested.  NN provided contact information and encouraged the pt to call with any questions or concerns.

## 2022-07-04 NOTE — Progress Notes (Signed)
[facility] [phone]) [phone]   [phone]  CONSULT NOTE  REFERRING PHYSICIAN: Dr. Levy Pupa  REASON FOR CONSULTATION:  67 years old white male recently diagnosed with lung cancer.  HPI Brendan Lara is a 67 y.o. male with past medical history significant for osteoarthritis, depression, COPD, postobstructive nausea and vomiting as well as dyspnea.  The patient also has long history of smoking.  He was seen for the lung cancer screening program and has CT scan of the chest on June 07, 2022 that showed the spiculated nodule centered about the right major fissure and involving the right lower and right middle lobes including at volume drip with a coagulant diameter of 19.7 mm.  The patient had CT super D of the chest on June 17, 2022 and that showed 1.97 cm bilobed spiculated pulmonary lesion identified on the recent lung cancer screening CT and crossing the major fissure to involve the anterior right lower lobe and posterior right middle lobe suspicious for primary bronchogenic neoplasm.  There was no evidence for metastatic disease in the chest.  On 06/20/2022 the patient underwent video bronchoscopy with robotic assisted bronchoscopic navigation.  The final pathology ([id number]) from the fine-needle aspiration of the left lower lobe as well as the brushing was consistent with adenocarcinoma.  The patient had a PET scan on 06/30/2022 and that showed the solid spiculated nodule centered around the major fissure of the right lung involving the anterior right lower lobe and posterior right middle lobe and was tracer avid and concerning for primary bronchogenic carcinoma.  Within the posterior left upper lobe there was a cystic nonsolid nodule measuring 1.4 cm with SUV max of 2.45 and low-grade pulmonary adenocarcinoma could not be completely excluded.  There was additional scattered cystic appearing nonsolid nodules noted in both lungs which exhibit mild increased  tracer uptake.  There was no signs of tracer avid nodal metastasis or distant metastatic disease.  Dr. Delton Coombes kindly referred the patient to me today for evaluation and recommendation regarding treatment of his condition. When seen today the patient is feeling fine with no concerning complaints except for mild cough productive of clear sputum and shortness of breath with exertion but no significant chest pain or hemoptysis.  He has frequent episode of nausea that has been going on for several years.  He was evaluated by gastroenterology in the past and no abnormalities.  He has some visual changes but no headache.  He denied having any vomiting, abdominal pain, diarrhea or constipation.  He has no recent weight loss or night sweats. Family history significant for mother with thyroid disease and dementia.  Father had congestive heart failure.  Sister had skin cancer and daughter had thyroid cancer. The patient is single and has 2 children a son and daughter.  His daughter lives in [place].  He is currently retired and used to work in [employer].  He has a history of smoking 1 pack/day for around 52 years and unfortunately he continues to smoke but trying to quit. He has no history of alcohol abuse but he had used marijuana in the past.  HPI   Past Medical History:  Diagnosis Date   Arthritis    Complication of anesthesia    Depression    Dyspnea    PONV (postoperative nausea and vomiting)     Past Surgical History:  Procedure Laterality Date   BRONCHIAL BIOPSY  06/20/2022   Procedure: BRONCHIAL BIOPSIES;  Surgeon: Leslye Peer, MD;  Location: [facility] ENDOSCOPY;  Service: Pulmonary;;   BRONCHIAL BRUSHINGS  06/20/2022   Procedure: BRONCHIAL BRUSHINGS;  Surgeon: Leslye Peer, MD;  Location: [facility] ENDOSCOPY;  Service: Pulmonary;;   BRONCHIAL NEEDLE ASPIRATION BIOPSY  06/20/2022   Procedure: BRONCHIAL NEEDLE ASPIRATION BIOPSIES;  Surgeon: Leslye Peer, MD;  Location: [facility] ENDOSCOPY;  Service:  Pulmonary;;   FACIAL RECONSTRUCTION SURGERY     head surgery     HEMOSTASIS CONTROL  06/20/2022   Procedure: HEMOSTASIS CONTROL;  Surgeon: Leslye Peer, MD;  Location: [facility] ENDOSCOPY;  Service: Pulmonary;;   NECK SURGERY     SHOULDER SURGERY     VIDEO BRONCHOSCOPY WITH RADIAL ENDOBRONCHIAL ULTRASOUND  06/20/2022   Procedure: VIDEO BRONCHOSCOPY WITH RADIAL ENDOBRONCHIAL ULTRASOUND;  Surgeon: Leslye Peer, MD;  Location: [facility] ENDOSCOPY;  Service: Pulmonary;;    Family History  Problem Relation Age of Onset   Thyroid disease Mother    Alzheimer's disease Mother    Heart failure Father     Social History Social History   Tobacco Use   Smoking status: Every Day    Packs/day: 0.50    Years: 51.00    Additional pack years: 0.00    Total pack years: 25.50    Types: Cigarettes   Smokeless tobacco: Never   Tobacco comments:    1 pack of cigarettes smoked a day. ARJ 06/14/22  Vaping Use   Vaping Use: Never used  Substance Use Topics   Alcohol use: Not Currently   Drug use: Yes    Types: Marijuana    Comment: once weekly    Allergies  Allergen Reactions   Codeine Shortness Of Breath and Nausea And Vomiting   Other Diarrhea and Nausea And Vomiting    Antibiotics     Current Outpatient Medications  Medication Sig Dispense Refill   ascorbic acid (VITAMIN C) 500 MG tablet Take 500 mg by mouth daily.     buPROPion (WELLBUTRIN XL) 300 MG 24 hr tablet Take 300 mg by mouth daily.     cholecalciferol (VITAMIN D3) 25 MCG (1000 UNIT) tablet Take 1,000 Units by mouth daily.     umeclidinium-vilanterol (ANORO ELLIPTA) 62.5-25 MCG/ACT AEPB Inhale 1 puff into the lungs daily. (Patient taking differently: Inhale 1 puff into the lungs daily as needed (sob).) 60 each 1   No current facility-administered medications for this visit.    Review of Systems  Constitutional: positive for fatigue Eyes: negative Ears, nose, mouth, throat, and face: negative Respiratory: positive for cough and  dyspnea on exertion Cardiovascular: negative Gastrointestinal: positive for nausea Genitourinary:negative Integument/breast: negative Hematologic/lymphatic: negative Musculoskeletal:negative Neurological: negative Behavioral/Psych: negative Endocrine: negative Allergic/Immunologic: negative  Physical Exam  ZOX:WRUEA, healthy, no distress, well nourished, and well developed SKIN: skin color, texture, turgor are normal, no rashes or significant lesions HEAD: Normocephalic, No masses, lesions, tenderness or abnormalities EYES: normal, PERRLA, Conjunctiva are pink and non-injected EARS: External ears normal, Canals clear OROPHARYNX:no exudate, no erythema, and lips, buccal mucosa, and tongue normal  NECK: supple, no adenopathy, no JVD LYMPH:  no palpable lymphadenopathy, no hepatosplenomegaly LUNGS: clear to auscultation , and palpation HEART: regular rate & rhythm, no murmurs, and no gallops ABDOMEN:abdomen soft, non-tender, normal bowel sounds, and no masses or organomegaly BACK: Back symmetric, no curvature., No CVA tenderness EXTREMITIES:no joint deformities, effusion, or inflammation, no edema  NEURO: alert & oriented x 3 with fluent speech, no focal motor/sensory deficits  PERFORMANCE STATUS: ECOG 1  LABORATORY DATA: Lab Results  Component Value Date  WBC 6.3 06/20/2022   HGB 17.2 (H) 06/20/2022   HCT 52.5 (H) 06/20/2022   MCV 96.7 06/20/2022   PLT 149 (L) 06/20/2022      Chemistry      Component Value Date/Time   NA 132 (L) 05/18/2018 1950   K 3.0 (L) 05/18/2018 1950   CL 91 (L) 05/18/2018 1950   CO2 29 05/18/2018 1950   BUN 14 05/18/2018 1950   CREATININE 0.71 05/18/2018 1950      Component Value Date/Time   CALCIUM 8.4 (L) 05/18/2018 1950   ALKPHOS 49 08/12/2007 1955   AST 14 08/12/2007 1955   ALT 12 08/12/2007 1955   BILITOT 0.8 08/12/2007 1955       RADIOGRAPHIC STUDIES: NM PET Image Initial (PI) Skull Base To Thigh  Result Date:  07/02/2022 CLINICAL DATA:  Initial treatment strategy for lung nodules EXAM: NUCLEAR MEDICINE PET SKULL BASE TO THIGH TECHNIQUE: 6.3 mCi F-18 FDG was injected intravenously. Full-ring PET imaging was performed from the skull base to thigh after the radiotracer. CT data was obtained and used for attenuation correction and anatomic localization. Fasting blood glucose: 104 mg/dl COMPARISON:  CT 16/12/9602 FINDINGS: Mediastinal blood pool activity: SUV max 2.10 Liver activity: SUV max NA NECK: No hypermetabolic lymph nodes in the neck. Incidental CT findings: None. CHEST: The nodule of concern within the along the major fissure of the right lung involving the posterior right middle lobe and anterior right lower lobe measures 1.7 cm and has an SUV max of 8.73, image 64/7. Within the posterior left upper lobe there is a non-solid, predominantly cystic nodule measuring 1.4 cm within SUV max of 2.45, image 82/4. Additional scattered, cystic appearing non solid nodules are noted in both lungs, which exhibit mild tracer uptake. For example, small focus of mild increased uptake (SUV max 1.27) is noted in the right upper lobe with small corresponding cystic nodule measuring 0.7 cm, image 63/4. No signs tracer avid mediastinal or hilar lymph nodes. No tracer avid axillary lymph nodes. Incidental CT findings: Emphysema and aortic atherosclerosis. Coronary artery calcifications. ABDOMEN/PELVIS: No abnormal hypermetabolic activity within the liver, pancreas, adrenal glands, or spleen. No hypermetabolic lymph nodes in the abdomen or pelvis. Incidental CT findings: Aortic atherosclerosis. Prostate gland enlargement. SKELETON: No focal hypermetabolic activity to suggest skeletal metastasis. Incidental CT findings: None. IMPRESSION: 1. The solid spiculated nodule centered around the major fissure of the right lung involving the anterior right lower lobe and posterior right middle lobe is tracer avid and concerning for primary  bronchogenic carcinoma. 2. Within the posterior left upper lobe there is a cystic, non solid nodule measuring 1.4 cm within SUV max of 2.45. Low-grade pulmonary adenocarcinoma cannot be excluded. 3. Additional scattered cystic appearing non-solid nodule nodules are noted within both lungs which exhibit mild increased tracer uptake. Surveillance imaging of these nodules is advised exclude the possibility of multifocal pulmonary adenocarcinoma. 4. No signs of tracer avid nodal metastasis or distant metastatic disease. Electronically Signed   By: Signa Kell M.D.   On: 07/02/2022 10:44   DG Chest Port 1 View  Result Date: 06/20/2022 CLINICAL DATA:  Post bronchoscopy with biopsy EXAM: PORTABLE CHEST 1 VIEW COMPARISON:  Portable exam 1023 hours compared to 05/18/2018 and correlated with CT chest 06/17/2022 FINDINGS: Normal heart size, mediastinal contours, and pulmonary vascularity. Emphysematous and bronchitic changes consistent with COPD. Scattered interstitial prominence. One of 2 obtained views shows a nodular density at the lower RIGHT lung, not seen on second view; this corresponds  to the spiculated mass identified on CT. No acute infiltrate, pleural effusion, or pneumothorax. Faint nipple shadow. IMPRESSION: COPD changes with nodular density lower RIGHT lung. No pneumothorax or pleural effusion post bronchoscopy and biopsy. Electronically Signed   By: Ulyses Southward M.D.   On: 06/20/2022 10:57   DG C-ARM BRONCHOSCOPY  Result Date: 06/20/2022 C-ARM BRONCHOSCOPY: Fluoroscopy was utilized by the requesting physician.  No radiographic interpretation.   CT Super D Chest Wo Contrast  Result Date: 06/20/2022 CLINICAL DATA:  Pulmonary nodule.  Pre-procedure planning. EXAM: CT CHEST WITHOUT CONTRAST TECHNIQUE: Multidetector CT imaging of the chest was performed using thin slice collimation for electromagnetic bronchoscopy planning purposes, without intravenous contrast. RADIATION DOSE REDUCTION: This exam was  performed according to the departmental dose-optimization program which includes automated exposure control, adjustment of the mA and/or kV according to patient size and/or use of iterative reconstruction technique. COMPARISON:  06/07/2022 FINDINGS: Cardiovascular: The heart size is normal. No substantial pericardial effusion. Mild atherosclerotic calcification is noted in the wall of the thoracic aorta. Mediastinum/Nodes: No mediastinal lymphadenopathy. No evidence for gross hilar lymphadenopathy although assessment is limited by the lack of intravenous contrast on the current study. The esophagus has normal imaging features. There is no axillary lymphadenopathy. Lungs/Pleura: Centrilobular and paraseptal emphysema evident. Biapical pleuroparenchymal scarring evident. 19.7 mm bilobed spiculated pulmonary lesion identified on recent lung cancer screening chest CT is visible on image 128/8 today, crossing the major fissure to involve the anterior right lower lobe and posterior right middle lobe. Scattered tiny parenchymal and subpleural nodules are again noted in both lungs, likely benign. A cluster of tree-in-bud nodularity in the peripheral left lower lobe is stable, compatible sequelae of atypical infection (image 141/8). No pleural effusion. Upper Abdomen: Visualized portion of the upper abdomen is unremarkable. Musculoskeletal: No worrisome lytic or sclerotic osseous abnormality. IMPRESSION: 1. 19.7 mm bilobed spiculated pulmonary lesion identified on recent lung cancer screening chest CT is visible today, crossing the major fissure to involve the anterior right lower lobe and posterior right middle lobe. Imaging features are consistent with primary bronchogenic neoplasm. 2. No evidence for metastatic disease in the chest. 3. Stable cluster of tree-in-bud nodularity in the peripheral left lower lobe, compatible with sequelae of atypical infection. 4. Aortic Atherosclerosis (ICD10-I70.0) and Emphysema  (ICD10-J43.9). Electronically Signed   By: Kennith Center M.D.   On: 06/20/2022 08:08   CT CHEST LUNG CA SCREEN LOW DOSE W/O CM  Result Date: 06/07/2022 CLINICAL DATA:  76 pack-year smoking history/current smoker EXAM: CT CHEST WITHOUT CONTRAST LOW-DOSE FOR LUNG CANCER SCREENING TECHNIQUE: Multidetector CT imaging of the chest was performed following the standard protocol without IV contrast. RADIATION DOSE REDUCTION: This exam was performed according to the departmental dose-optimization program which includes automated exposure control, adjustment of the mA and/or kV according to patient size and/or use of iterative reconstruction technique. COMPARISON:  08/11/2007 CTA chest.  No prior screening CT. FINDINGS: Cardiovascular: Aortic atherosclerosis. Normal heart size, without pericardial effusion. Lad coronary artery calcification. Mediastinum/Nodes: No mediastinal or hilar adenopathy, given limitations of unenhanced CT. Lungs/Pleura: No pleural fluid. Moderate centrilobular and paraseptal emphysema. Biapical pleuroparenchymal scarring. A spiculated nodule is centered about the right major fissure and involves the right lower and right middle lobes, including at volume derived equivalent diameter 19.7 mm on 249/3. Mild motion degradation in the lower chest. Clustered nodularity in the left lower lobe may be postinfectious or inflammatory and is mild on image 285. Other pulmonary nodules including at maximally volume derived equivalent diameter  6.2 mm in the right lower lobe. Upper Abdomen: Normal imaged portions of the liver, spleen, stomach, adrenal glands, left kidney. Probable punctate right renal collecting system calculus in the upper pole. Abdominal aortic atherosclerosis. Musculoskeletal: Right rotator cuff repair. Cervical spine fixation. Osteopenia. IMPRESSION: 1. Lung-RADS 4X, highly suspicious. Additional imaging evaluation or consultation with Pulmonology or Thoracic Surgery recommended. 19.7 mm  spiculated nodule within the right lower and right middle lobes, centered about the right major fissure. 2. No thoracic adenopathy. 3. Aortic atherosclerosis (ICD10-I70.0), coronary artery atherosclerosis and emphysema (ICD10-J43.9). 4. Probable right nephrolithiasis. These results will be called to the ordering clinician or representative by the Radiologist Assistant, and communication documented in the PACS or [employer]. Electronically Signed   By: Jeronimo Greaves M.D.   On: 06/07/2022 15:55    ASSESSMENT: This is a very pleasant 67 years old white male recently diagnosed with a stage Ia (T1b, N0, M0) non-small cell lung cancer, adenocarcinoma presented with a nodule along the major fissure of the right lung involving the posterior right middle lobe and the anterior right lower lobe diagnosed in April 2024.  The patient also has few other scattered cystic nonsolid nodules on both lungs that need close monitoring.   PLAN: I had a lengthy discussion with the patient today about his current condition and treatment options. I personally and independently reviewed the scan images and discussed the results with the patient today. I recommended for the patient to complete the staging workup by ordering MRI of the brain to rule out brain metastasis. The patient will also have pulmonary function test for evaluation of his lung capacity. I discussed with the patient his treatment options and he will be a good candidate for surgical resection but will need evaluation by cardiothoracic surgery for the final decision. I referred the patient to Dr. Dorris Fetch for discussion of this option. If the patient is not a good surgical candidate, he would benefit from SBRT to the right lung nodule with close monitoring of the other nonsolid cystic lesions. The patient will come back for follow-up visit after his surgical resection for close monitoring and imaging studies. For the smoking cessation I strongly encouraged  the patient to quit smoking. The patient was advised to call immediately if he has any other concerning symptoms in the interval. The patient voices understanding of current disease status and treatment options and is in agreement with the current care plan.  All questions were answered. The patient knows to call the clinic with any problems, questions or concerns. We can certainly see the patient much sooner if necessary.  Thank you so much for allowing me to participate in the care of Brendan Lara. I will continue to follow up the patient with you and assist in his care.  The total time spent in the appointment was 60 minutes.  Disclaimer: This note was dictated with voice recognition software. Similar sounding words can inadvertently be transcribed and may not be corrected upon review.   Lajuana Matte July 04, 2022, 10:46 AM

## 2022-07-04 NOTE — Progress Notes (Signed)
History of Present Illness Brendan Lara is a 67 y.o. male current every day smoker with COPD and abnormal screening Ct Chest. He is followed through the screening program, and by Dr. Delton Coombes  Synopsis 67 year old man, active smoker (76 pack years), with little past medical history beyond anxiety/depression, suspected COPD.  He was referred to the lung cancer screening program and underwent a CT chest 06/07/2022 . His scan was read as a 4X, highly suspicious . He reports that he has had low energy for 2 months. Some progressive exertional SOB. Worsening cough, white phlegm. No hemoptysis. Lost wt > 10 lbs in last 3 months.  he had had pulmonary nodular disease evaluated in 2009. CT-PA 08/11/2007 showed emphysema, biapical scar, left lower lobe superior segmental groundglass opacity. There was a 6 mm subpleural right lower lobe nodule, 4 mm right middle lobe nodule. No adenopathy. This prompted a PET scan done 08/24/2007. On that scan the left lower lobe superior segmental groundglass opacity had resolved. None of the other nodules had any hypermetabolism.  He was seen by Dr. Delton Coombes   06/14/2022 for the 4 X  Lung Cancer Screening result. This showed no mediastinal or hilar adenopathy, a spiculated nodule at the right major fissure involving the right lower and right middle lobes 1.9 cm (new compared with the scans above).  Scattered other pulmonary nodules largest 6 mm. Dr. Delton Coombes  made plans  for navigational bronchoscopy with biopsies  to evaluate a right-sided pulmonary nodule. This was done on 06/20/2022. Dr. Delton Coombes called the patient with the results of the biopsy on 06/22/2022. His right midlung mass showed adenocarcinoma. The right middle lobe endobronchial lesion showed squamous metaplasia but was negative for malignancy.  Dr. Delton Coombes referred him to thoracic oncology, and ordered a PET scan which was done 06/30/2022, results have been personally reviewed by me. ( See below)    10/05/2022 Pt. Presents for  follow up after bronchoscopy with biopsies 06/20/2022. He states he has been doing well since the procedure. He did have some scant blood in his sputum for a dy or two which self resolved. He denies any sore throat or fever, or purulent secretions. No chest pain.He does have his baseline  shortness of breath.He does continue to smoke, and is trying to quit. Patient was seen by Dr.  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 PFT's and MR brain were ordered. He referred him to Dr. Dorris Fetch for evaluation for surgery. If the patient is not a good surgical candidate, he would benefit from SBRT to the right lung nodule with close monitoring of the other nonsolid cystic lesions.  Dr. Dorris Fetch felt the best option was for stereotactic radiation. He felt surgery, while possible ,would leave him with an unacceptable quality of life.   He has an inhaler Anoro, but he does not use it.We will do a therapeutic trial of Trelegy to see if he gets any benefit.  He is working on quitting smoking. He has been counseled to quit. He has all appropriate follow up scheduled.    Test Results: Cytology 06/20/2022 FINAL MICROSCOPIC DIAGNOSIS:  A. LUNG, RLL, FINE NEEDLE ASPIRATION:  - Adenocarcinoma  - See comment   B. LUNG, RLL, BRUSH:  - Adenocarcinoma   COMMENT:  There is sufficient tissue for molecular testing in the cellblock for  specimen A.   PET scan  The solid spiculated nodule centered around the major fissure of the right lung involving the anterior right lower  lobe and posterior right middle lobe is tracer avid and concerning for primary bronchogenic carcinoma. 2. Within the posterior left upper lobe there is a cystic, non solid nodule measuring 1.4 cm within SUV max of 2.45. Low-grade pulmonary adenocarcinoma cannot be excluded. 3. Additional scattered cystic appearing non-solid nodule nodules are noted within both lungs which exhibit mild increased  tracer uptake. Surveillance imaging of these nodules is advised exclude the possibility of multifocal pulmonary adenocarcinoma. 4. No signs of tracer avid nodal metastasis or distant metastatic disease.  06/17/2022 Super D  CT Chest 19.7 mm bilobed spiculated pulmonary lesion identified on recent lung cancer screening chest CT is visible today, crossing the major fissure to involve the anterior right lower lobe and posterior right middle lobe. Imaging features are consistent with primary bronchogenic neoplasm. 2. No evidence for metastatic disease in the chest. 3. Stable cluster of tree-in-bud nodularity in the peripheral left lower lobe, compatible with sequelae of atypical infection. 4. Aortic Atherosclerosis (ICD10-I70.0) and Emphysema (ICD10-J43.9).  06/07/2022 Lung Cancer screening Lung-RADS 4X, highly suspicious. Additional imaging evaluation or consultation with Pulmonology or Thoracic Surgery recommended. 19.7 mm spiculated nodule within the right lower and right middle lobes, centered about the right major fissure.      Latest Ref Rng & Units 07/04/2022   11:09 AM 06/20/2022    7:01 AM 05/18/2018    7:50 PM  CBC  WBC 4.0 - 10.5 K/uL 6.9  6.3  4.9   Hemoglobin 13.0 - 17.0 g/dL 95.2  84.1  32.4   Hematocrit 39.0 - 52.0 % 50.1  52.5  46.5   Platelets 150 - 400 K/uL 176  149  149        Latest Ref Rng & Units 07/04/2022   11:09 AM 05/18/2018    7:50 PM 08/12/2007    7:55 PM  BMP  Glucose 70 - 99 mg/dL 401  027  253   BUN 8 - 23 mg/dL 16  14  14    Creatinine 0.61 - 1.24 mg/dL 6.64  4.03  4.74   Sodium 135 - 145 mmol/L 137  132  136   Potassium 3.5 - 5.1 mmol/L 4.4  3.0  3.7   Chloride 98 - 111 mmol/L 103  91  106   CO2 22 - 32 mmol/L 29  29  24    Calcium 8.9 - 10.3 mg/dL 9.5  8.4  8.2     BNP No results found for: "BNP"  ProBNP No results found for: "PROBNP"  PFT    Component Value Date/Time   FEV1PRE 1.26 07/14/2022 0825   FEV1POST 1.52 07/14/2022 0825    FVCPRE 2.96 07/14/2022 0825   FVCPOST 3.22 07/14/2022 0825   TLC 8.09 07/14/2022 0825   DLCOUNC 9.82 07/14/2022 0825   PREFEV1FVCRT 43 07/14/2022 0825   PSTFEV1FVCRT 47 07/14/2022 0825    No results found.   Past medical hx Past Medical History:  Diagnosis Date   Arthritis    Complication of anesthesia    Depression    Dyspnea    PONV (postoperative nausea and vomiting)      Social History   Tobacco Use   Smoking status: Every Day    Current packs/day: 0.50    Average packs/day: 0.5 packs/day for 51.0 years (25.5 ttl pk-yrs)    Types: Cigarettes   Smokeless tobacco: Never   Tobacco comments:    Pt states he is down to 1-3 ciggs per day. ALS 07/05/2022  Vaping Use   Vaping status: Never  Used  Substance Use Topics   Alcohol use: Not Currently   Drug use: Yes    Types: Marijuana    Comment: once weekly    Brendan Lara reports that he has been smoking cigarettes. He has a 25.5 pack-year smoking history. He has never used smokeless tobacco. He reports that he does not currently use alcohol. He reports current drug use. Drug: Marijuana.  Tobacco Cessation: Current every day smoker, 80 pack year smoking history  Past surgical hx, Family hx, Social hx all reviewed.  Current Outpatient Medications on File Prior to Visit  Medication Sig   traZODone (DESYREL) 50 MG tablet Take 50 mg by mouth at bedtime.   umeclidinium-vilanterol (ANORO ELLIPTA) 62.5-25 MCG/ACT AEPB Inhale 1 puff into the lungs daily. (Patient taking differently: Inhale 1 puff into the lungs daily as needed (sob).)   No current facility-administered medications on file prior to visit.     Allergies  Allergen Reactions   Codeine Shortness Of Breath and Nausea And Vomiting   Aspirin Other (See Comments)   Atorvastatin Other (See Comments)   Hydrocodone-Acetaminophen Other (See Comments)   Influenza Vaccines     Other Reaction(s): gets very sick   Other Diarrhea and Nausea And Vomiting    Antibiotics     Tiotropium Bromide Monohydrate     Other Reaction(s): shakiness    Review Of Systems:  Constitutional:   +  weight loss, night sweats,  Fevers, chills, fatigue, or  lassitude.  HEENT:   No headaches,  Difficulty swallowing,  Tooth/dental problems, or  Sore throat,                No sneezing, itching, ear ache, nasal congestion, post nasal drip,   CV:  No chest pain,  Orthopnea, PND, swelling in lower extremities, anasarca, dizziness, palpitations, syncope.   GI  No heartburn, indigestion, abdominal pain, nausea, vomiting, diarrhea, change in bowel habits, loss of appetite, bloody stools.   Resp: + shortness of breath with exertion less at rest.  No excess mucus, + productive cough,  No non-productive cough,  No coughing up of blood.  No change in color of mucus.  + occasional  wheezing.  No chest wall deformity  Skin: no rash or lesions.  GU: no dysuria, change in color of urine, no urgency or frequency.  No flank pain, no hematuria   MS:  No joint pain or swelling.  No decreased range of motion.  No back pain.  Psych:  No change in mood or affect. No depression or anxiety.  No memory loss.   Vital Signs BP 120/74 (BP Location: Left Arm, Patient Position: Sitting, Cuff Size: Normal)   Pulse 71   Wt 126 lb 3.2 oz (57.2 kg)   SpO2 97%   BMI 17.60 kg/m    Physical Exam:  General- No distress,  A&Ox3, pleasant ENT: No sinus tenderness, TM clear, pale nasal mucosa, no oral exudate,no post nasal drip, no LAN Cardiac: S1, S2, regular rate and rhythm, no murmur Chest: + wheeze/ No rales/ dullness; no accessory muscle use, no nasal flaring, no sternal retractions Abd.: Soft Non-tender, ND, BS +, Body mass index is 17.6 kg/m.  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 Current Every Day Smoker  MRI Brain negative  Plan  You have follow up scheduled with Dr. Dorris Fetch 08/02/2023 ay 12:30 in the afternoon.  You have an MRI Brain scheduled for 07/11/2022 at [facility] PFT's were ordered yesterday by Dr. Arbutus Ped so you will get a call to get this scheduled  Please work on quitting smoking completely. Call 1-800-QUIT NOW for free nicotine patches , gum or mints.  I am glad Aundra Millet has referred you to social work to help with this diagnosis.  Start Trelegy  and see if it helps with your shortness of breath.  Take this one puff, once daily,  every day without fail. Rinse mouth after use . Call if this helps so we can send in a prescription. Follow up in 6 months with Maralyn Sago NP or Br Byrum Please contact office for sooner follow up if symptoms do not improve or worsen or seek emergency care    I spent 30 minutes dedicated to the care of this patient on the date of this encounter to include pre-visit review of records, face-to-face time with the patient discussing conditions above, post visit ordering of testing, clinical documentation with the electronic health record, making appropriate referrals as documented, and communicating necessary information to the patient's healthcare team.   Bevelyn Ngo, NP 10/05/2022  4:42 PM

## 2022-07-05 ENCOUNTER — Encounter: Payer: Self-pay | Admitting: Acute Care

## 2022-07-05 ENCOUNTER — Ambulatory Visit (INDEPENDENT_AMBULATORY_CARE_PROVIDER_SITE_OTHER): Payer: Medicare (Managed Care) | Admitting: Acute Care

## 2022-07-05 VITALS — BP 120/74 | HR 71 | Wt 126.2 lb

## 2022-07-05 DIAGNOSIS — Z9889 Other specified postprocedural states: Secondary | ICD-10-CM | POA: Diagnosis not present

## 2022-07-05 DIAGNOSIS — C3491 Malignant neoplasm of unspecified part of right bronchus or lung: Secondary | ICD-10-CM | POA: Diagnosis not present

## 2022-07-05 DIAGNOSIS — F17200 Nicotine dependence, unspecified, uncomplicated: Secondary | ICD-10-CM | POA: Diagnosis not present

## 2022-07-05 NOTE — Patient Instructions (Addendum)
It is good to see you today. You have follow up scheduled with Dr. Dorris Fetch 08/02/2023 ay 12:30 in the afternoon.  You have an MRI Brain scheduled for 07/11/2022 at [facility] PFT's were ordered yesterday by Dr. Arbutus Ped so you will get a call to get this scheduled  Please work on quitting smoking completely. Call 1-800-QUIT NOW for free nicotine patches , gum or mints.  I am glad Aundra Millet has referred you to social work to help with this diagnosis.  Start Trelegy  and see if it helps with your shortness of breath.  Take this one puff, once daily,  every day without fail. Rinse mouth after use . Call if this helps so we can send in a prescription. Follow up in 6 months with Maralyn Sago NP or Br Byrum Please contact office for sooner follow up if symptoms do not improve or worsen or seek emergency care

## 2022-07-11 ENCOUNTER — Encounter (HOSPITAL_COMMUNITY): Payer: Self-pay

## 2022-07-11 ENCOUNTER — Ambulatory Visit (HOSPITAL_COMMUNITY)
Admission: RE | Admit: 2022-07-11 | Discharge: 2022-07-11 | Disposition: A | Discharge status: Home / Self Care | Payer: Medicare (Managed Care) | Source: Ambulatory Visit | Attending: Internal Medicine | Admitting: Internal Medicine

## 2022-07-11 ENCOUNTER — Other Ambulatory Visit (HOSPITAL_COMMUNITY): Payer: Self-pay | Admitting: Internal Medicine

## 2022-07-11 DIAGNOSIS — T1590XA Foreign body on external eye, part unspecified, unspecified eye, initial encounter: Secondary | ICD-10-CM | POA: Insufficient documentation

## 2022-07-11 DIAGNOSIS — C3490 Malignant neoplasm of unspecified part of unspecified bronchus or lung: Secondary | ICD-10-CM | POA: Diagnosis present

## 2022-07-11 DIAGNOSIS — W449XXA Unspecified foreign body entering into or through a natural orifice, initial encounter: Secondary | ICD-10-CM | POA: Insufficient documentation

## 2022-07-11 MED ORDER — GADOBUTROL 1 MMOL/ML IV SOLN
6.0000 mL | Freq: Once | INTRAVENOUS | Status: AC | PRN
  Administered 2022-07-11: 6 mL via INTRAVENOUS

## 2022-07-14 ENCOUNTER — Ambulatory Visit (HOSPITAL_COMMUNITY)
Admission: RE | Admit: 2022-07-14 | Discharge: 2022-07-14 | Disposition: A | Discharge status: Home / Self Care | Payer: Medicare (Managed Care) | Source: Ambulatory Visit | Attending: Internal Medicine | Admitting: Internal Medicine

## 2022-07-14 DIAGNOSIS — F17210 Nicotine dependence, cigarettes, uncomplicated: Secondary | ICD-10-CM | POA: Insufficient documentation

## 2022-07-14 DIAGNOSIS — R059 Cough, unspecified: Secondary | ICD-10-CM | POA: Insufficient documentation

## 2022-07-14 DIAGNOSIS — R062 Wheezing: Secondary | ICD-10-CM | POA: Insufficient documentation

## 2022-07-14 DIAGNOSIS — R942 Abnormal results of pulmonary function studies: Secondary | ICD-10-CM | POA: Diagnosis not present

## 2022-07-14 DIAGNOSIS — R0609 Other forms of dyspnea: Secondary | ICD-10-CM | POA: Insufficient documentation

## 2022-07-14 DIAGNOSIS — J988 Other specified respiratory disorders: Secondary | ICD-10-CM | POA: Insufficient documentation

## 2022-07-14 DIAGNOSIS — R911 Solitary pulmonary nodule: Secondary | ICD-10-CM

## 2022-07-14 LAB — PULMONARY FUNCTION TEST
DL/VA % pred: 39 %
DL/VA: 1.63 ml/min/mmHg/L
DLCO cor % pred: 33 %
DLCO cor: 9.17 ml/min/mmHg
DLCO unc % pred: 35 %
DLCO unc: 9.82 ml/min/mmHg
FEF 25-75 Post: 0.6 L/sec
FEF 25-75 Pre: 0.37 L/sec
FEF2575-%Change-Post: 63 %
FEF2575-%Pred-Post: 21 %
FEF2575-%Pred-Pre: 13 %
FEV1-%Change-Post: 20 %
FEV1-%Pred-Post: 43 %
FEV1-%Pred-Pre: 35 %
FEV1-Post: 1.52 L
FEV1-Pre: 1.26 L
FEV1FVC-%Change-Post: 11 %
FEV1FVC-%Pred-Pre: 57 %
FEV6-%Change-Post: 14 %
FEV6-%Pred-Post: 62 %
FEV6-%Pred-Pre: 54 %
FEV6-Post: 2.8 L
FEV6-Pre: 2.45 L
FEV6FVC-%Change-Post: 5 %
FEV6FVC-%Pred-Post: 91 %
FEV6FVC-%Pred-Pre: 86 %
FVC-%Change-Post: 8 %
FVC-%Pred-Post: 67 %
FVC-%Pred-Pre: 62 %
FVC-Post: 3.22 L
FVC-Pre: 2.96 L
Post FEV1/FVC ratio: 47 %
Post FEV6/FVC ratio: 87 %
Pre FEV1/FVC ratio: 43 %
Pre FEV6/FVC Ratio: 82 %
RV % pred: 199 %
RV: 4.84 L
TLC % pred: 112 %
TLC: 8.09 L

## 2022-07-14 MED ORDER — ALBUTEROL SULFATE (2.5 MG/3ML) 0.083% IN NEBU
2.5000 mg | INHALATION_SOLUTION | Freq: Once | RESPIRATORY_TRACT | Status: AC
  Administered 2022-07-14: 2.5 mg via RESPIRATORY_TRACT

## 2022-07-15 ENCOUNTER — Encounter (HOSPITAL_COMMUNITY): Payer: Self-pay

## 2022-07-18 ENCOUNTER — Telehealth: Payer: Self-pay

## 2022-07-18 NOTE — Telephone Encounter (Signed)
This nurse received a call from [employer].  Rep named Hillary Bow stated she is processing authorization of [employer] and needed to verify if this is a recurrence.  This nurse informed according to patient chart this is newly diagnosed. She acknowledged understanding.  No further questions or concerns noted at this time.

## 2022-07-20 ENCOUNTER — Telehealth: Payer: Self-pay | Admitting: Medical Oncology

## 2022-07-20 NOTE — Telephone Encounter (Signed)
Per Dr Arbutus Ped, brain scan results shared with pt.

## 2022-08-02 ENCOUNTER — Institutional Professional Consult (permissible substitution): Payer: Medicare (Managed Care) | Admitting: Thoracic Surgery (Cardiothoracic Vascular Surgery)

## 2022-08-02 ENCOUNTER — Encounter: Payer: Self-pay | Admitting: Thoracic Surgery (Cardiothoracic Vascular Surgery)

## 2022-08-02 VITALS — BP 105/62 | HR 60 | Resp 20 | Ht 71.0 in | Wt 129.0 lb

## 2022-08-02 DIAGNOSIS — C3491 Malignant neoplasm of unspecified part of right bronchus or lung: Secondary | ICD-10-CM | POA: Diagnosis not present

## 2022-08-02 NOTE — Progress Notes (Signed)
PCP is [name], MD Referring Provider is Si Gaul, MD  Chief Complaint  Patient presents with   Lung Cancer    New patient consultation PET 4/11, Bronch 4/1, chest CT 3/29, PFTs 4/25, MRI Brain 4/22    HPI: [name] is sent for consultation regarding an adenocarcinoma of the right lung.  Brendan Lara is a 67 year old man with a history of tobacco abuse, COPD, depression, and arthritis.  He had a low-dose CT for lung cancer screening which showed a 20 mm nodule crossing the major fissure between the lower and middle lobes.  He underwent a robotic bronchoscopy which revealed adenocarcinoma.  On PET/CT the nodule is hypermetabolic.  There was no evidence of regional or distant metastatic disease.  He was seen by Dr. Arbutus Ped and now is referred for consideration for surgical resection.  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  Smoked as much is 2 packs/day for 40 years prior to cutting back significantly.  Says he is smoking less than half a pack a day currently.  Complains of shortness of breath with exertion and says he could walk up 2 flights of stairs.  No chest pain, pressure, or tightness.  Sometimes forgets to take his inhaler.    Past Medical History:  Diagnosis Date   Arthritis    Complication of anesthesia    Depression    Dyspnea    PONV (postoperative nausea and vomiting)     Past Surgical History:  Procedure Laterality Date   BRONCHIAL BIOPSY  06/20/2022   Procedure: BRONCHIAL BIOPSIES;  Surgeon: Leslye Peer, MD;  [facility]COPY;  Service: Pulmonary;;   BRONCHIAL BRUSHINGS  06/20/2022   Procedure: BRONCHIAL BRUSHINGS;  Surgeon: Leslye Peer, MD;  Location: [facility] ENDOSCOPY;  Service: Pulmonary;;   BRONCHIAL NEEDLE ASPIRATION BIOPSY  06/20/2022   Procedure: BRONCHIAL NEEDLE ASPIRATION BIOPSIES;  Surgeon: Leslye Peer, MD;  Location: [facility] ENDOSCOPY;  Service: Pulmonary;;   FACIAL RECONSTRUCTION SURGERY     head  surgery     HEMOSTASIS CONTROL  06/20/2022   Procedure: HEMOSTASIS CONTROL;  Surgeon: Leslye Peer, MD;  Location: [facility] ENDOSCOPY;  Service: Pulmonary;;   NECK SURGERY     SHOULDER SURGERY     VIDEO BRONCHOSCOPY WITH RADIAL ENDOBRONCHIAL ULTRASOUND  06/20/2022   Procedure: VIDEO BRONCHOSCOPY WITH RADIAL ENDOBRONCHIAL ULTRASOUND;  Surgeon: Leslye Peer, MD;  Location: [facility] ENDOSCOPY;  Service: Pulmonary;;    Family History  Problem Relation Age of Onset   Thyroid disease Mother    Alzheimer's disease Mother    Heart failure Father     Social History Social History   Tobacco Use   Smoking status: Every Day    Packs/day: 0.50    Years: 51.00    Additional pack years: 0.00    Total pack years: 25.50    Types: Cigarettes   Smokeless tobacco: Never   Tobacco comments:    Pt states he is down to 1-3 ciggs per day. ALS 07/05/2022  Vaping Use   Vaping Use: Never used  Substance Use Topics   Alcohol use: Not Currently   Drug use: Yes    Types: Marijuana    Comment: once weekly    Current Outpatient Medications  Medication Sig Dispense Refill   traZODone (DESYREL) 50 MG tablet Take 50 mg by mouth at bedtime.     umeclidinium-vilanterol (ANORO ELLIPTA) 62.5-25 MCG/ACT AEPB Inhale 1 puff into the lungs daily. (Patient taking differently: Inhale 1  puff into the lungs daily as needed (sob).) 60 each 1   No current facility-administered medications for this visit.    Allergies  Allergen Reactions   Codeine Shortness Of Breath and Nausea And Vomiting   Influenza Vaccines     Other Reaction(s): gets very sick   Other Diarrhea and Nausea And Vomiting    Antibiotics    Tiotropium Bromide Monohydrate     Other Reaction(s): shakiness    Review of Systems  Constitutional:  Positive for unexpected weight change (Has lost 30 pounds over past 2 years, none in past 3 months). Negative for activity change and fatigue.  HENT:  Positive for dental problem (Dentures).   Eyes:  Negative for  visual disturbance.  Respiratory:  Positive for cough and shortness of breath. Negative for wheezing.   Cardiovascular:  Negative for chest pain and leg swelling.  Neurological:  Negative for seizures and weakness.  Hematological:  Bruises/bleeds easily.  Psychiatric/Behavioral:  Positive for dysphoric mood.     BP 105/62 (BP Location: Left Arm, Patient Position: Sitting, Cuff Size: Normal)   Pulse 60   Resp 20   Ht 5\' 11"  (1.803 m)   Wt 129 lb (58.5 kg)   SpO2 92% Comment: RA  BMI 17.99 kg/m  Physical Exam Vitals reviewed.  Constitutional:      Comments: thin  HENT:     Head: Normocephalic and atraumatic.  Eyes:     General: No scleral icterus. Neck:     Vascular: No carotid bruit.  Cardiovascular:     Rate and Rhythm: Normal rate and regular rhythm.     Heart sounds: Normal heart sounds. No murmur heard.    No friction rub. No gallop.  Pulmonary:     Effort: Pulmonary effort is normal. No respiratory distress.     Breath sounds: No wheezing or rales.     Comments: Diminished breath sounds bilaterally Abdominal:     General: There is no distension.     Palpations: Abdomen is soft.  Musculoskeletal:     Cervical back: Neck supple.  Lymphadenopathy:     Cervical: No cervical adenopathy.  Skin:    General: Skin is warm and dry.  Neurological:     General: No focal deficit present.     Mental Status: He is alert and oriented to person, place, and time.     Cranial Nerves: No cranial nerve deficit.     Motor: No weakness.     Diagnostic Tests: NUCLEAR MEDICINE PET SKULL BASE TO THIGH   TECHNIQUE: 6.3 mCi F-18 FDG was injected intravenously. Full-ring PET imaging was performed from the skull base to thigh after the radiotracer. CT data was obtained and used for attenuation correction and anatomic localization.   Fasting blood glucose: 104 mg/dl   COMPARISON:  CT 11/91/4782   FINDINGS: Mediastinal blood pool activity: SUV max 2.10   Liver activity: SUV max  NA   NECK: No hypermetabolic lymph nodes in the neck.   Incidental CT findings: None.   CHEST: The nodule of concern within the along the major fissure of the right lung involving the posterior right middle lobe and anterior right lower lobe measures 1.7 cm and has an SUV max of 8.73, image 64/7.   Within the posterior left upper lobe there is a non-solid, predominantly cystic nodule measuring 1.4 cm within SUV max of 2.45, image 82/4.   Additional scattered, cystic appearing non solid nodules are noted in both lungs, which exhibit mild tracer uptake.  For example, small focus of mild increased uptake (SUV max 1.27) is noted in the right upper lobe with small corresponding cystic nodule measuring 0.7 cm, image 63/4.   No signs tracer avid mediastinal or hilar lymph nodes. No tracer avid axillary lymph nodes.   Incidental CT findings: Emphysema and aortic atherosclerosis. Coronary artery calcifications.   ABDOMEN/PELVIS: No abnormal hypermetabolic activity within the liver, pancreas, adrenal glands, or spleen. No hypermetabolic lymph nodes in the abdomen or pelvis.   Incidental CT findings: Aortic atherosclerosis. Prostate gland enlargement.   SKELETON: No focal hypermetabolic activity to suggest skeletal metastasis.   Incidental CT findings: None.   IMPRESSION: 1. The solid spiculated nodule centered around the major fissure of the right lung involving the anterior right lower lobe and posterior right middle lobe is tracer avid and concerning for primary bronchogenic carcinoma. 2. Within the posterior left upper lobe there is a cystic, non solid nodule measuring 1.4 cm within SUV max of 2.45. Low-grade pulmonary adenocarcinoma cannot be excluded. 3. Additional scattered cystic appearing non-solid nodule nodules are noted within both lungs which exhibit mild increased tracer uptake. Surveillance imaging of these nodules is advised exclude the possibility of multifocal  pulmonary adenocarcinoma. 4. No signs of tracer avid nodal metastasis or distant metastatic disease.     Electronically Signed   By: Signa Kell M.D.   On: 07/02/2022 10:44 I personally reviewed the CT and PET/CT images.  There is a roughly 2 cm spiculated nodule involving the right lower and middle lobe crossing the fissure that is hypermetabolic.  Low-grade activity and cystic left upper lobe area.  Pulmonary function testing FVC 2.96 (62%) FEV1 1.26 (35%) FEV1 post 1.52 (43%) DLCO 9.17 (33%)  Impression: Brendan Lara is a 67 year old man with a history of tobacco abuse, COPD, depression, and arthritis.  He had a low-dose CT for lung cancer screening which showed a 20 mm nodule crossing the major fissure between the lower and middle lobes.  He underwent a robotic bronchoscopy which revealed adenocarcinoma.   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  The ideal surgical procedure would be a right lower middle bilobectomy.  He does not have adequate pulmonary function to tolerate that operation.  Could potentially try to do a right middle lobectomy and a segmentectomy of the lower lobe but would require at least 2 segments and likely more to have an adequate margin.  Given his severe diffusion impairment I suspect I would leave him with an unacceptable quality of life.  I think his best option would be stereotactic radiation.  Emphasized importance of complete tobacco cessation.  Plan: Refer to radiation oncology Follow-up with Dr. Shirline Frees I will be happy to see Brendan Lara back if I can be of assistance with his care in the future  I spent over 30 minutes in review of records, images, and in consultation with Mr. Capo today.  Loreli Slot, MD [facility] [phone]

## 2022-08-03 ENCOUNTER — Other Ambulatory Visit: Payer: Self-pay | Admitting: Physician Assistant

## 2022-08-03 ENCOUNTER — Telehealth: Payer: Self-pay

## 2022-08-03 DIAGNOSIS — C3491 Malignant neoplasm of unspecified part of right bronchus or lung: Secondary | ICD-10-CM

## 2022-08-03 NOTE — Telephone Encounter (Signed)
This nurse reached out to patient and informed him that due to him not being a surgical candidate Dr. Dorris Fetch has referred him to Radiation Oncology.  Advised that his oncology provider is in agreement with this plan.  Informed that he will receive a call to schedule that appointment and he will follow up with [place] in September after he is done with radiation and we will repeat his CT scan at that time.  Patient acknowledged understanding and knows that he can call if he has any other questions or concerns.

## 2022-08-03 NOTE — Progress Notes (Signed)
Per Dr. Arbutus Ped, refer to radiation oncology for SBRT. We will see the patient back in 4 months after restaging CT scan of the chest. Order placed.

## 2022-08-09 ENCOUNTER — Telehealth: Payer: Self-pay | Admitting: Medical Oncology

## 2022-08-09 ENCOUNTER — Other Ambulatory Visit: Payer: Self-pay

## 2022-08-09 DIAGNOSIS — C3491 Malignant neoplasm of unspecified part of right bronchus or lung: Secondary | ICD-10-CM

## 2022-08-09 NOTE — Telephone Encounter (Signed)
Has not heard from radiation and is angry about it.Please call him.  Message sent to [facility] and [place].

## 2022-08-09 NOTE — Progress Notes (Signed)
Radiation Oncology referral placed and IB message sent to Rad Onc Admin team.

## 2022-08-09 NOTE — Progress Notes (Signed)
Thoracic Location of Tumor / Histology: Adenocarcinoma of right lung, stage 1   CT Super D Chest Wo Contrast 06/17/2022  IMPRESSION: 1. 19.7 mm bilobed spiculated pulmonary lesion identified on recent lung cancer screening chest CT is visible today, crossing the major fissure to involve the anterior right lower lobe and posterior right middle lobe. Imaging features are consistent with primary bronchogenic neoplasm. 2. No evidence for metastatic disease in the chest. 3. Stable cluster of tree-in-bud nodularity in the peripheral left lower lobe, compatible with sequelae of atypical infection. 4. Aortic Atherosclerosis (ICD10-I70.0) and Emphysema (ICD10-J43.9).  NM PET Image Initial (PI) Skull Base To Thigh 06/30/2022  IMPRESSION: 1. The solid spiculated nodule centered around the major fissure of the right lung involving the anterior right lower lobe and posterior right middle lobe is tracer avid and concerning for primary bronchogenic carcinoma. 2. Within the posterior left upper lobe there is a cystic, non solid nodule measuring 1.4 cm within SUV max of 2.45. Low-grade pulmonary adenocarcinoma cannot be excluded. 3. Additional scattered cystic appearing non-solid nodule nodules are noted within both lungs which exhibit mild increased tracer uptake. Surveillance imaging of these nodules is advised exclude the possibility of multifocal pulmonary adenocarcinoma. 4. No signs of tracer avid nodal metastasis or distant metastatic disease.  Patient presented with symptoms of: Complains of shortness of breath with exertion and says he could walk up 2 flights of stairs Positive for unexpected weight change (Has lost 30 pounds over past 2 years, none in past 3 months). Positive for cough and shortness of breath.   Biopsies revealed:  06-20-22 FINAL MICROSCOPIC DIAGNOSIS:   A. LUNG, RIGHT MIDDLE LOBE ANTERIOR SEGMENT, ENDOBRONCHIAL BIOPSY:  Bronchial mucosa with squamous metaplasia and benign  cartilage.  Negative for malignancy.  See comment.   COMMENT:  There are portions of benign bronchial mucosa with focal squamous  metaplasia.  There is also a portion of benign cartilage which likely  originated from the bronchial wall.  In the proper clinical and  radiographic context, the presence of cartilage can be seen with  chondroid hamartoma; however, the findings are not diagnostic of  chondroid hamartoma in this biopsy.  The right lower lobe cytology specimen ([id number]) shows adenocarcinoma.    Tobacco/Marijuana/Snuff/ETOH use:  Smoked as much is 2 packs/day for 40 years prior to cutting back significantly. Says he is smoking less than half a pack a day currently.   Past/Anticipated interventions by cardiothoracic surgery, if any:  Video Bronchoscopy with Robotic Assisted Bronchoscopic Navigation    Date of Operation: 06/20/2022    Pre-op Diagnosis: Right lung mass, involving the right lower lobe and right middle lobe   Post-op Diagnosis: Right lower lobe/middle lobe mass, right middle lobe endobronchial mass   Surgeon: Clayborne Artist, MD 08/02/2022  Impression: Brendan Lara is a 67 year old man with a history of tobacco abuse, COPD, depression, and arthritis.  He had a low-dose CT for lung cancer screening which showed a 20 mm nodule crossing the major fissure between the lower and middle lobes.  He underwent a robotic bronchoscopy which revealed adenocarcinoma.    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  The ideal surgical procedure would be a right lower middle bilobectomy.  He does not have adequate pulmonary function to tolerate that operation.  Could potentially try to do a right middle lobectomy and a segmentectomy of the lower lobe but would require at least 2 segments and likely more to have an adequate margin.  Given his severe diffusion impairment I suspect I would leave him with an unacceptable quality of  life.   I think his best option would be stereotactic radiation.   Emphasized importance of complete tobacco cessation.   Plan: Refer to radiation oncology Follow-up with Dr. Shirline Frees I will be happy to see Brendan Lara back if I can be of assistance with his care in the future   Past/Anticipated interventions by medical oncology, if any:  Si Gaul, MD 07/04/2022  ASSESSMENT: This is a very pleasant 67 years old white male recently diagnosed with a stage Ia (T1b, N0, M0) non-small cell lung cancer, adenocarcinoma presented with a nodule along the major fissure of the right lung involving the posterior right middle lobe and the anterior right lower lobe diagnosed in April 2024.  The patient also has few other scattered cystic nonsolid nodules on both lungs that need close monitoring.     PLAN: I had a lengthy discussion with the patient today about his current condition and treatment options. I personally and independently reviewed the scan images and discussed the results with the patient today. I recommended for the patient to complete the staging workup by ordering MRI of the brain to rule out brain metastasis. The patient will also have pulmonary function test for evaluation of his lung capacity. I discussed with the patient his treatment options and he will be a good candidate for surgical resection but will need evaluation by cardiothoracic surgery for the final decision. I referred the patient to Dr. Dorris Fetch for discussion of this option. If the patient is not a good surgical candidate, he would benefit from SBRT to the right lung nodule with close monitoring of the other nonsolid cystic lesions. The patient will come back for follow-up visit after his surgical resection for close monitoring and imaging studies. For the smoking cessation I strongly encouraged the patient to quit smoking. The patient was advised to call immediately if he has any other concerning symptoms in  the interval. The patient voices understanding of current disease status and treatment options and is in agreement with the current care plan.  Signs/Symptoms Weight changes, if any: reports decreased appetite over the years Wt Readings from Last 3 Encounters:  08/18/22 128 lb 3.2 oz (58.2 kg)  08/02/22 129 lb (58.5 kg)  07/05/22 126 lb 3.2 oz (57.2 kg)   Respiratory complaints, if any: Reports random bouts of difficulty breathing. States some days he can walk long distances with no issue, and other times he has difficulty breathing even at rest.  Hemoptysis, if any: Denies Pain issues, if any:  Reports occasional pain to his right side (especially when laying on that side to sleep), but otherwise denies any concerns   SAFETY ISSUES: Prior radiation? No Pacemaker/ICD? No  Possible current pregnancy? na Is the patient on methotrexate? no  Current Complaints / other details:  Nothing else of note

## 2022-08-17 NOTE — Progress Notes (Signed)
Radiation Oncology         [phone] ________________________________  Initial Outpatient Consultation  Name: Brendan Lara MRN: [id number]  Date: 08/18/2022  DOB: 17-Aug-1955  WU:[id number], Onalee Hua, MD  Loreli Slot, *   REFERRING PHYSICIAN: Loreli Slot, *  DIAGNOSIS: The primary encounter diagnosis was Malignant neoplasm of middle lobe, bronchus or lung (HCC). A diagnosis of Adenocarcinoma of right lung, stage 1 (HCC) was also pertinent to this visit.  Adenocarcinoma of the right lower lobe    Cancer Staging  Adenocarcinoma of right lung, stage 1 (HCC) Staging form: Lung, AJCC 8th Edition - Clinical: Stage IA2 (cT1b, cN0, cM0) - Signed by Si Gaul, MD on 07/04/2022  HISTORY OF PRESENT ILLNESS::Brendan Lara is a 67 y.o. male who is seen as a courtesy of Dr. Dorris Fetch for an opinion concerning radiation therapy as part of management for his recently diagnosed right lung cancer.   The patient has a significant smoking history. He subsequently participates in the lung cancer screening program and follows with Dr. Delton [facility]omb[facility] at [facility].   He presented for a lung cancer screening CT on 06/07/22 which demonstrated a spiculated nodule centered about the right major fissure and involving the right lower and right middle lobes, measuring 19.7 mm in volume derived equivalent diameter (Lung-RADS 4X, highly suspicious). Several other pulmonary nodules were also appreciated bilaterally. CT otherwise showed no evidence of thoracic adenopathy.    Subsequently, Dr. Delton Coombes recommended proceeding with a super-D chest CT on 06/17/22 which redemonstrated the 19.7 mm nodule involving the anterior right lower lobe and posterior right middle lobe. CT otherwise showed no evidence of metastatic disease in the chest and a stable cluster of tree-in-bud nodularity in the peripheral left lower lobe, compatible with sequelae of atypical infection.  Accordingly, the patient  underwent bronchoscopy with biopsies on 06/20/22 under the care of Dr. Delton Coombes. FNA of the RLL collected showed findings consistent with adenocarcinoma. RML endobrachial biopsy showed no evidence of malignancy.   PET scan on 06/30/22 showed the solid spiculated nodule centered around the major fissure of the right lung involving the anterior right lower lobe and posterior right middle lobe as tracer avid and concerning for primary bronchogenic carcinoma. PET also showed a non solid nodule measuring 1.4 cm within SUV max of 2.45 in the posterior LUL, and additional scattered cystic appearing non-solid nodule nodules within both lungs with mild increased tracer uptake. PET otherwise showed no signs of tracer avid nodal metastatic disease or distant metastatic disease.   Molecular studies including Foundation One and PD-L1 testing were ordered and showed MSI-stable and a TPS score of 0%.  He was accordingly referred to Dr. Arbutus Ped on 07/04/22 to discuss treatment options. Surgical resection and the role of radiation therapy were reviewed at that time, and a referral was placed to Dr. Dorris Fetch on 08/02/22. Based on his PFT results, Dr. Dorris Fetch does not advise surgery and recommends stereotactic radiation therapy which we will discuss in detail today.   Other pertinent imaging performed thus far includes an MRI of the brain on 07/11/22 which showed no evidence of intracranial metastatic disease.   Of note: the patient's case was also presented at the tumor board on 06/30/22.   Patient states he has intermittent shortness of breath. Some days his breathing is normal, while on other days he experiences difficulty breathing at rest. He endorses right sided chest pain, especially when he lies down on his side. He denies any hemoptysis.  PREVIOUS RADIATION THERAPY:  No  PAST MEDICAL HISTORY:  Past Medical History:  Diagnosis Date   Arthritis    Complication of anesthesia    Depression    Dyspnea     PONV (postoperative nausea and vomiting)     PAST SURGICAL HISTORY: Past Surgical History:  Procedure Laterality Date   BRONCHIAL BIOPSY  06/20/2022   Procedure: BRONCHIAL BIOPSIES;  Surgeon: Leslye Peer, MD;  Location: [facility] ENDOSCOPY;  Service: Pulmonary;;   BRONCHIAL BRUSHINGS  06/20/2022   Procedure: BRONCHIAL BRUSHINGS;  Surgeon: Leslye Peer, MD;  Location: [facility] ENDOSCOPY;  Service: Pulmonary;;   BRONCHIAL NEEDLE ASPIRATION BIOPSY  06/20/2022   Procedure: BRONCHIAL NEEDLE ASPIRATION BIOPSIES;  Surgeon: Leslye Peer, MD;  Location: [facility] ENDOSCOPY;  Service: Pulmonary;;   FACIAL RECONSTRUCTION SURGERY     head surgery     HEMOSTASIS CONTROL  06/20/2022   Procedure: HEMOSTASIS CONTROL;  Surgeon: Leslye Peer, MD;  Location: [facility] ENDOSCOPY;  Service: Pulmonary;;   NECK SURGERY     SHOULDER SURGERY     VIDEO BRONCHOSCOPY WITH RADIAL ENDOBRONCHIAL ULTRASOUND  06/20/2022   Procedure: VIDEO BRONCHOSCOPY WITH RADIAL ENDOBRONCHIAL ULTRASOUND;  Surgeon: Leslye Peer, MD;  Location: [facility] ENDOSCOPY;  Service: Pulmonary;;    FAMILY HISTORY:  Family History  Problem Relation Age of Onset   Thyroid disease Mother    Alzheimer's disease Mother    Heart failure Father     SOCIAL HISTORY:  Social History   Tobacco Use   Smoking status: Every Day    Packs/day: 0.50    Years: 51.00    Additional pack years: 0.00    Total pack years: 25.50    Types: Cigarettes   Smokeless tobacco: Never   Tobacco comments:    Pt states he is down to 1-3 ciggs per day. ALS 07/05/2022  Vaping Use   Vaping Use: Never used  Substance Use Topics   Alcohol use: Not Currently   Drug use: Yes    Types: Marijuana    Comment: once weekly    ALLERGIES:  Allergies  Allergen Reactions   Codeine Shortness Of Breath and Nausea And Vomiting   Aspirin Other (See Comments)   Atorvastatin Other (See Comments)   Hydrocodone-Acetaminophen Other (See Comments)   Influenza Vaccines     Other Reaction(s): gets very  sick   Other Diarrhea and Nausea And Vomiting    Antibiotics    Tiotropium Bromide Monohydrate     Other Reaction(s): shakiness    MEDICATIONS:  Current Outpatient Medications  Medication Sig Dispense Refill   traZODone (DESYREL) 50 MG tablet Take 50 mg by mouth at bedtime.     umeclidinium-vilanterol (ANORO ELLIPTA) 62.5-25 MCG/ACT AEPB Inhale 1 puff into the lungs daily. (Patient taking differently: Inhale 1 puff into the lungs daily as needed (sob).) 60 each 1   No current facility-administered medications for this encounter.    REVIEW OF SYSTEMS:  As per HPI   PHYSICAL EXAM:  weight is 128 lb 3.2 Brendan (58.2 kg). His temperature is 97.5 F (36.4 C) (abnormal). His blood pressure is 106/71 and his pulse is 71. His respiration is 20 and oxygen saturation is 99%.   General: Alert and oriented, in no acute distress HEENT: Head is normocephalic. Extraocular movements are intact.  Neck: Neck is supple, no palpable cervical or supraclavicular lymphadenopathy. Heart: Regular in rate and rhythm with no murmurs, rubs, or gallops. Chest: Clear to auscultation bilaterally, with no rhonchi, wheezes, or rales. Abdomen: Soft, nontender, nondistended, with no  rigidity or guarding. Extremities: No cyanosis or edema. Lymphatics: see Neck Exam Skin: No concerning lesions. Musculoskeletal: symmetric strength and muscle tone throughout. Neurologic: Cranial nerves II through XII are grossly intact. No obvious focalities. Speech is fluent. Coordination is intact. Psychiatric: Judgment and insight are intact. Affect is appropriate.  ECOG = 1  0 - Asymptomatic (Fully active, able to carry on all predisease activities without restriction)  1 - Symptomatic but completely ambulatory (Restricted in physically strenuous activity but ambulatory and able to carry out work of a light or sedentary nature. For example, light housework, office work)  2 - Symptomatic, <50% in bed during the day (Ambulatory and  capable of all self care but unable to carry out any work activities. Up and about more than 50% of waking hours)  3 - Symptomatic, >50% in bed, but not bedbound (Capable of only limited self-care, confined to bed or chair 50% or more of waking hours)  4 - Bedbound (Completely disabled. Cannot carry on any self-care. Totally confined to bed or chair)  5 - Death   Santiago Glad MM, Creech RH, Tormey DC, et al. [phone]). "Toxicity and response criteria of the [facility]". Am. Evlyn Clines. Oncol. 5 (6): 649-55  LABORATORY DATA:  Lab Results  Component Value Date   WBC 6.9 07/04/2022   HGB 17.4 (H) 07/04/2022   HCT 50.1 07/04/2022   MCV 92.9 07/04/2022   PLT 176 07/04/2022   NEUTROABS 4.3 07/04/2022   Lab Results  Component Value Date   NA 137 07/04/2022   K 4.4 07/04/2022   CL 103 07/04/2022   CO2 29 07/04/2022   GLUCOSE 105 (H) 07/04/2022   BUN 16 07/04/2022   CREATININE 1.00 07/04/2022   CALCIUM 9.5 07/04/2022      RADIOGRAPHY: No results found. Dr. Roselind Messier personally reviewed imaging.     IMPRESSION: Adenocarcinoma of the right lower lobe   This is a 67 yo male with biopsied confirmed stage IA (T1b, N0, M0) adenocarcinoma of the right lung. Dr. Dorris Fetch evaluated the patient and determined that, due to inadequate pulmonary function, he is not a candidate for surgery. Dr. Roselind Messier recommends stereotactic body radiation (SBRT) to the 2.0 cm right lower lobe nodule.   Today, I talked to the patient  about the findings and work-up thus far.  We discussed the natural history of early stage lung disease and general treatment, highlighting the role of radiotherapy in the management. We reviewed his images together.  We discussed the available radiation techniques, and focused on the details of logistics and delivery.  We reviewed the anticipated acute and late sequelae associated with radiation in this setting.  The patient was encouraged to ask questions that I answered to  the best of my ability. A patient consent form was discussed and signed.  We retained a copy for our records.  The patient would like to proceed with radiation.  PLAN: Patient is scheduled for CT simulation on 08/24/22. Dr. Roselind Messier anticipates 3-5 fractions of SBRT to the right lower lobe nodule.    60 minutes of total time was spent for this patient encounter, including preparation, face-to-face counseling with the patient and coordination of care, physical exam, and documentation of the encounter.   ------------------------------------------------   Joyice Faster, PA-C   Billie Lade, PhD, MD  This document serves as a record of services personally performed by Antony Blackbird, MD. It was created on his behalf by Neena Rhymes, a trained medical scribe. The creation of  this record is based on the scribe's personal observations and the provider's statements to them. This document has been checked and approved by the attending provider.

## 2022-08-18 ENCOUNTER — Telehealth: Payer: Self-pay

## 2022-08-18 ENCOUNTER — Encounter: Payer: Self-pay | Admitting: Radiation Oncology

## 2022-08-18 ENCOUNTER — Ambulatory Visit
Admission: RE | Admit: 2022-08-18 | Discharge: 2022-08-18 | Disposition: A | Discharge status: Home / Self Care | Payer: Medicare (Managed Care) | Source: Ambulatory Visit | Attending: Radiation Oncology | Admitting: Radiation Oncology

## 2022-08-18 VITALS — BP 106/71 | HR 71 | Temp 97.5°F | Resp 20 | Wt 128.2 lb

## 2022-08-18 DIAGNOSIS — C3431 Malignant neoplasm of lower lobe, right bronchus or lung: Secondary | ICD-10-CM | POA: Insufficient documentation

## 2022-08-18 DIAGNOSIS — Z79899 Other long term (current) drug therapy: Secondary | ICD-10-CM | POA: Diagnosis not present

## 2022-08-18 DIAGNOSIS — F17210 Nicotine dependence, cigarettes, uncomplicated: Secondary | ICD-10-CM | POA: Insufficient documentation

## 2022-08-18 DIAGNOSIS — C3491 Malignant neoplasm of unspecified part of right bronchus or lung: Secondary | ICD-10-CM

## 2022-08-18 DIAGNOSIS — C342 Malignant neoplasm of middle lobe, bronchus or lung: Secondary | ICD-10-CM

## 2022-08-18 DIAGNOSIS — R0789 Other chest pain: Secondary | ICD-10-CM | POA: Insufficient documentation

## 2022-08-18 NOTE — Telephone Encounter (Signed)
CSW attempted to contact patient per the request of the medical provider to assess psychosocial needs.  Left vm.

## 2022-08-24 ENCOUNTER — Ambulatory Visit
Admission: RE | Admit: 2022-08-24 | Discharge: 2022-08-24 | Disposition: A | Discharge status: Home / Self Care | Payer: Medicare (Managed Care) | Source: Ambulatory Visit | Attending: Radiation Oncology | Admitting: Radiation Oncology

## 2022-08-24 DIAGNOSIS — C342 Malignant neoplasm of middle lobe, bronchus or lung: Secondary | ICD-10-CM | POA: Diagnosis present

## 2022-08-24 DIAGNOSIS — Z510 Encounter for antineoplastic radiation therapy: Secondary | ICD-10-CM | POA: Diagnosis present

## 2022-08-24 DIAGNOSIS — F17210 Nicotine dependence, cigarettes, uncomplicated: Secondary | ICD-10-CM | POA: Diagnosis not present

## 2022-08-24 DIAGNOSIS — C3491 Malignant neoplasm of unspecified part of right bronchus or lung: Secondary | ICD-10-CM

## 2022-09-02 DIAGNOSIS — Z510 Encounter for antineoplastic radiation therapy: Secondary | ICD-10-CM | POA: Diagnosis not present

## 2022-09-07 ENCOUNTER — Other Ambulatory Visit: Payer: Self-pay

## 2022-09-07 ENCOUNTER — Ambulatory Visit
Admission: RE | Admit: 2022-09-07 | Discharge: 2022-09-07 | Disposition: A | Discharge status: Home / Self Care | Payer: Medicare (Managed Care) | Source: Ambulatory Visit | Attending: Radiation Oncology | Admitting: Radiation Oncology

## 2022-09-07 DIAGNOSIS — C3491 Malignant neoplasm of unspecified part of right bronchus or lung: Secondary | ICD-10-CM

## 2022-09-07 DIAGNOSIS — Z510 Encounter for antineoplastic radiation therapy: Secondary | ICD-10-CM | POA: Diagnosis not present

## 2022-09-07 LAB — RAD ONC ARIA SESSION SUMMARY
Course Elapsed Days: 0
Plan Fractions Treated to Date: 1
Plan Prescribed Dose Per Fraction: 18 Gy
Plan Total Fractions Prescribed: 3
Plan Total Prescribed Dose: 54 Gy
Reference Point Dosage Given to Date: 18 Gy
Reference Point Session Dosage Given: 18 Gy
Session Number: 1

## 2022-09-09 ENCOUNTER — Other Ambulatory Visit: Payer: Self-pay

## 2022-09-09 ENCOUNTER — Ambulatory Visit
Admission: RE | Admit: 2022-09-09 | Discharge: 2022-09-09 | Disposition: A | Discharge status: Home / Self Care | Payer: Medicare (Managed Care) | Source: Ambulatory Visit | Attending: Radiation Oncology | Admitting: Radiation Oncology

## 2022-09-09 DIAGNOSIS — C3491 Malignant neoplasm of unspecified part of right bronchus or lung: Secondary | ICD-10-CM

## 2022-09-09 DIAGNOSIS — Z510 Encounter for antineoplastic radiation therapy: Secondary | ICD-10-CM | POA: Diagnosis not present

## 2022-09-09 LAB — RAD ONC ARIA SESSION SUMMARY
Course Elapsed Days: 2
Plan Fractions Treated to Date: 2
Plan Prescribed Dose Per Fraction: 18 Gy
Plan Total Fractions Prescribed: 3
Plan Total Prescribed Dose: 54 Gy
Reference Point Dosage Given to Date: 36 Gy
Reference Point Session Dosage Given: 18 Gy
Session Number: 2

## 2022-09-12 ENCOUNTER — Ambulatory Visit
Admission: RE | Admit: 2022-09-12 | Discharge: 2022-09-12 | Disposition: A | Discharge status: Home / Self Care | Payer: Medicare (Managed Care) | Source: Ambulatory Visit | Attending: Radiation Oncology | Admitting: Radiation Oncology

## 2022-09-12 ENCOUNTER — Other Ambulatory Visit: Payer: Self-pay

## 2022-09-12 DIAGNOSIS — C3491 Malignant neoplasm of unspecified part of right bronchus or lung: Secondary | ICD-10-CM

## 2022-09-12 DIAGNOSIS — Z510 Encounter for antineoplastic radiation therapy: Secondary | ICD-10-CM | POA: Diagnosis not present

## 2022-09-12 LAB — RAD ONC ARIA SESSION SUMMARY
Course Elapsed Days: 5
Plan Fractions Treated to Date: 3
Plan Prescribed Dose Per Fraction: 18 Gy
Plan Total Fractions Prescribed: 3
Plan Total Prescribed Dose: 54 Gy
Reference Point Dosage Given to Date: 54 Gy
Reference Point Session Dosage Given: 18 Gy
Session Number: 3

## 2022-09-13 NOTE — Radiation Completion Notes (Signed)
Patient Name: Brendan Lara, Brendan Lara MRN: [id number] Date of Birth: Jul 31, 1955 Referring Physician: Charlett Lango, M.D. Date of Service: 2022-09-13 Radiation Oncologist: Arnette Schaumann, M.D. [facility] [place]                             RADIATION ONCOLOGY END OF TREATMENT NOTE     Diagnosis: C34.2 Malignant neoplasm of middle lobe, bronchus or lung Staging on 2022-07-04: Adenocarcinoma of right lung, stage 1 (HCC) T=cT1b, N=cN0, M=cM0 Intent: Curative     ==========DELIVERED PLANS==========  First Treatment Date: 2022-09-07 - Last Treatment Date: 2022-09-12   Plan Name: Lung_Rt_SBRT Site: Lung, Right Technique: SBRT/SRT-IMRT Mode: Photon Dose Per Fraction: 18 Gy Prescribed Dose (Delivered / Prescribed): 54 Gy / 54 Gy Prescribed Fxs (Delivered / Prescribed): 3 / 3     ==========ON TREATMENT VISIT DATES========== 2022-09-07, 2022-09-09, 2022-09-12, 2022-09-12     ==========UPCOMING VISITS==========       ==========APPENDIX - ON TREATMENT VISIT NOTES==========   See weekly On Treatment Notes in Epic for details.

## 2022-09-14 ENCOUNTER — Ambulatory Visit: Payer: Medicare (Managed Care) | Admitting: Radiation Oncology

## 2022-09-16 ENCOUNTER — Ambulatory Visit: Payer: Medicare (Managed Care) | Admitting: Radiation Oncology

## 2022-09-28 ENCOUNTER — Telehealth: Payer: Self-pay

## 2022-09-28 NOTE — Telephone Encounter (Signed)
Patient called in to report increased fatigue, insomnia and shortness of breath since completing 3 SBRT treatments to right lung. Patient completed treatment on 09/12/22. Made patient aware that side effects may last a few weeks after radiation treatment. Pls advise.

## 2022-10-05 ENCOUNTER — Encounter: Payer: Self-pay | Admitting: Acute Care

## 2022-10-12 NOTE — Progress Notes (Signed)
Radiation Oncology         [phone] ________________________________  Name: PRESS CASALE MRN: [id number]  Date: 10/13/2022  DOB: Jun 25, 1955  Follow-Up Visit Note  CC: Brendan Joe, MD  [name], *  No diagnosis found.  Diagnosis: The primary encounter diagnosis was Malignant neoplasm of middle lobe, bronchus or lung (HCC). A diagnosis of Adenocarcinoma of right lung, stage 1 (HCC) was also pertinent to this visit.   Adenocarcinoma of the right lower lobe    Cancer Staging  Adenocarcinoma of right lung, stage 1 (HCC) Staging form: Lung, AJCC 8th Edition - Clinical: Stage IA2 (cT1b, cN0, cM0) - Signed by Si Gaul, MD on 07/04/2022  Interval Since Last Radiation: 1 month and 1 day  Indication for treatment: Curative       Radiation treatment dates: 09/07/22 through 09/12/22  Site/dose: Right lung - 54 Gy delivered in 3 Fx at 18 Gy/Fx Beams/energy: 6X-FFF  Narrative:  The patient returns today for routine follow-up. The patient tolerated radiation treatment relatively well.  On the date of his final treatment, the patient endorsed mild right chest pain, fatigue, shortness of breath, a productive cough, and some swallowing issues. He denied any other symptoms or concerns from treatment.   No other significant interval history since the patient completed radiation therapy, or in the interval since he was seen in consultation.   ***                            Allergies:  is allergic to codeine, aspirin, atorvastatin, hydrocodone-acetaminophen, influenza vaccines, other, and tiotropium bromide monohydrate.  Meds: Current Outpatient Medications  Medication Sig Dispense Refill   traZODone (DESYREL) 50 MG tablet Take 50 mg by mouth at bedtime.     umeclidinium-vilanterol (ANORO ELLIPTA) 62.5-25 MCG/ACT AEPB Inhale 1 puff into the lungs daily. (Patient taking differently: Inhale 1 puff into the lungs daily as needed (sob).) 60 each 1   No current  facility-administered medications for this encounter.    Physical Findings: The patient is in no acute distress. Patient is alert and oriented.  vitals were not taken for this visit. .  No significant changes. Lungs are clear to auscultation bilaterally. Heart has regular rate and rhythm. No palpable cervical, supraclavicular, or axillary adenopathy. Abdomen soft, non-tender, normal bowel sounds.   Lab Findings: Lab Results  Component Value Date   WBC 6.9 07/04/2022   HGB 17.4 (H) 07/04/2022   HCT 50.1 07/04/2022   MCV 92.9 07/04/2022   PLT 176 07/04/2022    Radiographic Findings: No results found.  Impression: The primary encounter diagnosis was Malignant neoplasm of middle lobe, bronchus or lung (HCC). A diagnosis of Adenocarcinoma of right lung, stage 1 (HCC) was also pertinent to this visit.   Adenocarcinoma of the right lower lobe    The patient is recovering from the effects of radiation.  ***  Plan:  ***   *** minutes of total time was spent for this patient encounter, including preparation, face-to-face counseling with the patient and coordination of care, physical exam, and documentation of the encounter. ____________________________________  Billie Lade, PhD, MD  This document serves as a record of services personally performed by Antony Blackbird, MD. It was created on his behalf by Neena Rhymes, a trained medical scribe. The creation of this record is based on the scribe's personal observations and the provider's statements to them. This document has been checked and approved by the attending  provider.

## 2022-10-12 NOTE — Progress Notes (Signed)
  Radiation Oncology         [phone] ________________________________  Name: Brendan Lara MRN: [id number]  Date: 10/13/2022  DOB: 1956/01/03  End of Treatment Note  Diagnosis: The primary encounter diagnosis was Malignant neoplasm of middle lobe, bronchus or lung (HCC). A diagnosis of Adenocarcinoma of right lung, stage 1 (HCC) was also pertinent to this visit.   Adenocarcinoma of the right lower lobe    Cancer Staging  Adenocarcinoma of right lung, stage 1 (HCC) Staging form: Lung, AJCC 8th Edition - Clinical: Stage IA2 (cT1b, cN0, cM0) - Signed by Si Gaul, MD on 07/04/2022  Indication for treatment: Curative        Radiation treatment dates: 09/07/22 through 09/12/22   Site/dose: Right lung - 54 Gy delivered in 3 Fx at 18 Gy/Fx  Beams/energy: 6X-FFF  Narrative: The patient tolerated radiation treatment relatively well.  On the date of his final treatment, the patient endorsed mild right chest pain, fatigue, shortness of breath, a productive cough, and some swallowing issues.   Plan: The patient has completed radiation treatment. The patient will return to radiation oncology clinic for routine followup in one month. I advised them to call or return sooner if they have any questions or concerns related to their recovery or treatment.  -----------------------------------  Billie Lade, PhD, MD  This document serves as a record of services personally performed by Antony Blackbird, MD. It was created on his behalf by Neena Rhymes, a trained medical scribe. The creation of this record is based on the scribe's personal observations and the provider's statements to them. This document has been checked and approved by the attending provider.

## 2022-10-13 ENCOUNTER — Other Ambulatory Visit: Payer: Self-pay | Admitting: Acute Care

## 2022-10-13 ENCOUNTER — Ambulatory Visit
Admission: RE | Admit: 2022-10-13 | Discharge: 2022-10-13 | Disposition: A | Discharge status: Home / Self Care | Payer: Medicare (Managed Care) | Source: Ambulatory Visit | Attending: Radiation Oncology | Admitting: Radiation Oncology

## 2022-10-13 VITALS — BP 113/77 | HR 73 | Temp 98.0°F | Resp 18 | Wt 121.2 lb

## 2022-10-13 DIAGNOSIS — C342 Malignant neoplasm of middle lobe, bronchus or lung: Secondary | ICD-10-CM | POA: Insufficient documentation

## 2022-10-13 DIAGNOSIS — Z923 Personal history of irradiation: Secondary | ICD-10-CM | POA: Diagnosis not present

## 2022-10-13 DIAGNOSIS — J449 Chronic obstructive pulmonary disease, unspecified: Secondary | ICD-10-CM

## 2022-10-13 DIAGNOSIS — C3491 Malignant neoplasm of unspecified part of right bronchus or lung: Secondary | ICD-10-CM

## 2022-10-13 MED ORDER — TRELEGY ELLIPTA 100-62.5-25 MCG/ACT IN AEPB
1.0000 | INHALATION_SPRAY | Freq: Every day | RESPIRATORY_TRACT | 0 refills | Status: AC
Start: 2022-10-13 — End: 2022-11-10

## 2022-10-13 MED ORDER — METHYLPREDNISOLONE 4 MG PO TBPK
ORAL_TABLET | ORAL | 0 refills | Status: DC
Start: 2022-10-13 — End: 2022-12-06

## 2022-10-13 NOTE — Progress Notes (Signed)
Brendan Lara is here today for follow up post radiation to the lung.  Lung Side: \Right Lung  Does the patient complain of any of the following: Pain:No Shortness of breath w/wo exertion: No Cough: Yes, mild Hemoptysis: No Pain with swallowing: Yes Swallowing/choking concerns: Yes Appetite: Poor Energy Level: Moderate Post radiation skin Changes: No changes reported    Additional comments if applicable:

## 2022-11-16 ENCOUNTER — Telehealth: Payer: Self-pay | Admitting: *Deleted

## 2022-11-16 NOTE — Telephone Encounter (Signed)
PT states Ms. Alexandria Lodge gave him a sample of Trellogy and the past two times he has gone to pick it up it is not at [facility].  Please call in for him. Thanks.    AVS says:  We will do a therapeutic trial of Trelegy to see if he gets any benefit   [phone] his #

## 2022-11-18 MED ORDER — TRELEGY ELLIPTA 100-62.5-25 MCG/ACT IN AEPB: 1.0000 | INHALATION_SPRAY | Freq: Every morning | RESPIRATORY_TRACT | 1 refills | Status: AC

## 2022-11-18 NOTE — Telephone Encounter (Signed)
Called patient.  Per Kandice Robinsons, NP note from 07/05/2022:  Start Trelegy  and see if it helps with your shortness of breath.  Take this one puff, once daily,  every day without fail. Rinse mouth after use . Call if this helps so we can send in a prescription. Follow up in 6 months with Maralyn Sago NP or Velva Harman  Patient is going out of town today.  Will send in rx for Trelegy to [facility].  Per EPIC, patient has follow up scheduled for 12/19/2022 with Dr. Wynona Neat.  Patient verbalized understanding.  10/13/22 orders only by Maralyn Sago shows Trelegy 100 mcg.

## 2022-11-18 NOTE — Telephone Encounter (Signed)
Pt. Calling for 2X he is going out of town this afternoon please advise pt. That it got called into drug store

## 2022-11-24 ENCOUNTER — Telehealth: Payer: Self-pay | Admitting: Internal Medicine

## 2022-11-24 NOTE — Telephone Encounter (Signed)
Called patient regarding September appointments, patient is nitrified.

## 2022-11-30 ENCOUNTER — Other Ambulatory Visit: Payer: Self-pay | Admitting: *Deleted

## 2022-11-30 DIAGNOSIS — C3491 Malignant neoplasm of unspecified part of right bronchus or lung: Secondary | ICD-10-CM

## 2022-12-01 ENCOUNTER — Inpatient Hospital Stay: Payer: Medicare (Managed Care) | Attending: Internal Medicine

## 2022-12-01 ENCOUNTER — Ambulatory Visit (HOSPITAL_COMMUNITY)
Admission: RE | Admit: 2022-12-01 | Discharge: 2022-12-01 | Disposition: A | Discharge status: Home / Self Care | Payer: Medicare (Managed Care) | Source: Ambulatory Visit | Attending: Physician Assistant | Admitting: Physician Assistant

## 2022-12-01 DIAGNOSIS — Z85118 Personal history of other malignant neoplasm of bronchus and lung: Secondary | ICD-10-CM | POA: Insufficient documentation

## 2022-12-01 DIAGNOSIS — Z923 Personal history of irradiation: Secondary | ICD-10-CM | POA: Insufficient documentation

## 2022-12-01 DIAGNOSIS — C3491 Malignant neoplasm of unspecified part of right bronchus or lung: Secondary | ICD-10-CM

## 2022-12-01 LAB — CBC WITH DIFFERENTIAL (CANCER CENTER ONLY)
Abs Immature Granulocytes: 0.02 10*3/uL (ref 0.00–0.07)
Basophils Absolute: 0 10*3/uL (ref 0.0–0.1)
Basophils Relative: 1 %
Eosinophils Absolute: 0.2 10*3/uL (ref 0.0–0.5)
Eosinophils Relative: 2 %
HCT: 49 % (ref 39.0–52.0)
Hemoglobin: 16.9 g/dL (ref 13.0–17.0)
Immature Granulocytes: 0 %
Lymphocytes Relative: 25 %
Lymphs Abs: 1.6 10*3/uL (ref 0.7–4.0)
MCH: 32.6 pg (ref 26.0–34.0)
MCHC: 34.5 g/dL (ref 30.0–36.0)
MCV: 94.4 fL (ref 80.0–100.0)
Monocytes Absolute: 0.6 10*3/uL (ref 0.1–1.0)
Monocytes Relative: 9 %
Neutro Abs: 4.1 10*3/uL (ref 1.7–7.7)
Neutrophils Relative %: 63 %
Platelet Count: 192 10*3/uL (ref 150–400)
RBC: 5.19 MIL/uL (ref 4.22–5.81)
RDW: 13.5 % (ref 11.5–15.5)
WBC Count: 6.5 10*3/uL (ref 4.0–10.5)
nRBC: 0 % (ref 0.0–0.2)

## 2022-12-01 LAB — CMP (CANCER CENTER ONLY)
ALT: 15 U/L (ref 0–44)
AST: 16 U/L (ref 15–41)
Albumin: 4.1 g/dL (ref 3.5–5.0)
Alkaline Phosphatase: 64 U/L (ref 38–126)
Anion gap: 4 — ABNORMAL LOW (ref 5–15)
BUN: 16 mg/dL (ref 8–23)
CO2: 30 mmol/L (ref 22–32)
Calcium: 8.9 mg/dL (ref 8.9–10.3)
Chloride: 102 mmol/L (ref 98–111)
Creatinine: 0.86 mg/dL (ref 0.61–1.24)
GFR, Estimated: >60 mL/min (ref >60)
Glucose, Bld: 104 mg/dL — ABNORMAL HIGH (ref 70–99)
Potassium: 4.2 mmol/L (ref 3.5–5.1)
Sodium: 136 mmol/L (ref 135–145)
Total Bilirubin: 0.6 mg/dL (ref 0.3–1.2)
Total Protein: 6.7 g/dL (ref 6.5–8.1)

## 2022-12-01 MED ORDER — SODIUM CHLORIDE (PF) 0.9 % IJ SOLN
INTRAMUSCULAR | Status: AC
  Filled 2022-12-01: qty 50

## 2022-12-01 MED ORDER — IOHEXOL 300 MG/ML  SOLN
75.0000 mL | Freq: Once | INTRAMUSCULAR | Status: AC | PRN
  Administered 2022-12-01: 75 mL via INTRAVENOUS

## 2022-12-05 NOTE — Progress Notes (Signed)
Radiation Oncology         [phone] ________________________________  Name: Brendan Lara MRN: [id number]  Date: 12/06/2022  DOB: [birth year]  Follow-Up Visit Note  CC: Brendan Joe, MD  Brendan Lara, *  No diagnosis found.  Diagnosis: The primary encounter diagnosis was Malignant neoplasm of middle lobe, bronchus or lung (HCC). A diagnosis of Adenocarcinoma of right lung, stage 1 (HCC) was also pertinent to this visit.   Adenocarcinoma of the right lower lobe   Cancer Staging  Adenocarcinoma of right lung, stage 1 (HCC) Staging form: Lung, AJCC 8th Edition - Clinical: Stage IA2 (cT1b, cN0, cM0) - Signed by Si Gaul, MD on 07/04/2022  Interval Since Last Radiation: 2 months and 24 days   Indication for treatment: Curative       Radiation treatment dates: 09/07/22 through 09/12/22  Site/dose: Right lung - 54 Gy delivered in 3 Fx at 18 Gy/Fx Beams/energy: 6X-FFF  Narrative:  The patient returns today for routine follow-up and to review recent imaging. He was last seen here for follow-up on 10/13/22.    His most recent chest CT with contrast on 12/01/22 demonstrated: a positive response to therapy with some regression of previously noted lesion in the lower right lung; no interval change in the other previously noted solid and sub-solid lesions in the lungs, and no evidence of mediastinal or hilar lymphadenopathy to suggest nodal metastasis. CT also showed evidence of underlying COPD.        No other significant interval history since the patient was last seen for follow-up.   ***                Allergies:  is allergic to codeine, aspirin, atorvastatin, hydrocodone-acetaminophen, influenza vaccines, other, and tiotropium bromide monohydrate.  Meds: Current Outpatient Medications  Medication Sig Dispense Refill   Fluticasone-Umeclidin-Vilant (TRELEGY ELLIPTA) 100-62.5-25 MCG/ACT AEPB Inhale 1 puff into the lungs every morning. 28 each 1    methylPREDNISolone (MEDROL DOSEPAK) 4 MG TBPK tablet Take 6 tablets (24 mg total) by mouth on day 1, Take 5 tablets (20 mg total) by mouth on day 2, take 4 tablets (16 mg total) by mouth on day 3, take 3 tablets (12 mg total) by mouth on day 4, take 2 tablets (8 mg total) by mouth on day 5, take 1 tablet (4mg  total) by mouth on day 6 21 tablet 0   traZODone (DESYREL) 50 MG tablet Take 50 mg by mouth at bedtime.     No current facility-administered medications for this encounter.    Physical Findings: The patient is in no acute distress. Patient is alert and oriented.  vitals were not taken for this visit. .  No significant changes. Lungs are clear to auscultation bilaterally. Heart has regular rate and rhythm. No palpable cervical, supraclavicular, or axillary adenopathy. Abdomen soft, non-tender, normal bowel sounds.   Lab Findings: Lab Results  Component Value Date   WBC 6.5 12/01/2022   HGB 16.9 12/01/2022   HCT 49.0 12/01/2022   MCV 94.4 12/01/2022   PLT 192 12/01/2022    Radiographic Findings: CT Chest W Contrast  Result Date: 12/05/2022 CLINICAL DATA:  67 year old male with history of non-small cell lung cancer status post radiation therapy. Follow-up study. * Tracking Code: BO * EXAM: CT CHEST WITH CONTRAST TECHNIQUE: Multidetector CT imaging of the chest was performed during intravenous contrast administration. RADIATION DOSE REDUCTION: This exam was performed according to the departmental dose-optimization program which includes automated exposure control, adjustment of the  mA and/or kV according to patient size and/or use of iterative reconstruction technique. CONTRAST:  75mL OMNIPAQUE IOHEXOL 300 MG/ML  SOLN COMPARISON:  Chest CT 06/17/2022.  PET-CT 06/30/2022. FINDINGS: Cardiovascular: Heart size is normal. There is no significant pericardial fluid, thickening or pericardial calcification. There is aortic atherosclerosis, as well as atherosclerosis of the great vessels of the  mediastinum and the coronary arteries, including calcified atherosclerotic plaque in the left anterior descending coronary artery. Mediastinum/Nodes: No pathologically enlarged mediastinal or hilar lymph nodes. Esophagus is unremarkable in appearance. No axillary lymphadenopathy. Lungs/Pleura: Treated nodule in the lower right lung straddling the major fissure between the right middle and lower lobes (axial image 121 of series 5) is smaller and appears overall less bulky than the prior examination, currently measuring 1.7 x 1.7 cm. Subtle cystic lesion in the posterior aspect of the left upper lobe (axial image 85 of series 5) measures 2.2 x 2.3 cm and has no definite internal solid component. A similar-appearing cystic lesion is also noted in the left lower lobe (axial image 92 of series 5) measuring 2.2 x 1.2 cm, also with no solid component. 5 mm nodule in the periphery of the inferior segment of the lingula (axial image 133 of series 5), 5 mm right lower lobe pulmonary nodule (axial image 131 of series 5) and 4 mm right middle lobe pulmonary nodule (axial image 30 of series 5 all similar in retrospect compared to prior examinations. Diffuse bronchial wall thickening with mild to moderate centrilobular and paraseptal emphysema. Upper Abdomen: Aortic atherosclerosis. Musculoskeletal: Orthopedic fixation hardware in the lower cervical spine incidentally noted. There are no aggressive appearing lytic or blastic lesions noted in the visualized portions of the skeleton. IMPRESSION: 1. Today's study demonstrates a positive response to therapy with some regression of previously noted lesion in the lower right lung, as detailed above. Other previously noted solid and subsolid lesions in the lungs are also similar to the prior study. 2. No enlarged mediastinal or hilar lymphadenopathy to suggest nodal metastasis. 3. Diffuse bronchial wall thickening with mild to moderate centrilobular and paraseptal emphysema; imaging  findings suggestive of underlying COPD. 4. Aortic atherosclerosis, in addition to left anterior descending coronary artery disease. Please note that although the presence of coronary artery calcium documents the presence of coronary artery disease, the severity of this disease and any potential stenosis cannot be assessed on this non-gated CT examination. Assessment for potential risk factor modification, dietary therapy or pharmacologic therapy may be warranted, if clinically indicated. Aortic Atherosclerosis (ICD10-I70.0) and Emphysema (ICD10-J43.9). Electronically Signed   By: Trudie Reed M.D.   On: 12/05/2022 10:21    Impression: The primary encounter diagnosis was Malignant neoplasm of middle lobe, bronchus or lung (HCC). A diagnosis of Adenocarcinoma of right lung, stage 1 (HCC) was also pertinent to this visit.   Adenocarcinoma of the right lower lobe  The patient is recovering from the effects of radiation.  ***  Plan:  ***   *** minutes of total time was spent for this patient encounter, including preparation, face-to-face counseling with the patient and coordination of care, physical exam, and documentation of the encounter. ____________________________________  Billie Lade, PhD, MD  This document serves as a record of services personally performed by Antony Blackbird, MD. It was created on his behalf by Neena Rhymes, a trained medical scribe. The creation of this record is based on the scribe's personal observations and the provider's statements to them. This document has been checked and approved by the attending  provider.

## 2022-12-06 ENCOUNTER — Encounter: Payer: Self-pay | Admitting: Radiation Oncology

## 2022-12-06 ENCOUNTER — Ambulatory Visit: Payer: Medicare (Managed Care) | Admitting: Internal Medicine

## 2022-12-06 ENCOUNTER — Ambulatory Visit
Admission: RE | Admit: 2022-12-06 | Discharge: 2022-12-06 | Disposition: A | Discharge status: Home / Self Care | Payer: Medicare (Managed Care) | Source: Ambulatory Visit | Attending: Radiation Oncology | Admitting: Radiation Oncology

## 2022-12-06 VITALS — BP 120/71 | HR 78 | Temp 97.5°F | Resp 18 | Ht 71.0 in | Wt 122.2 lb

## 2022-12-06 DIAGNOSIS — C3491 Malignant neoplasm of unspecified part of right bronchus or lung: Secondary | ICD-10-CM

## 2022-12-06 DIAGNOSIS — C342 Malignant neoplasm of middle lobe, bronchus or lung: Secondary | ICD-10-CM | POA: Diagnosis not present

## 2022-12-06 DIAGNOSIS — J449 Chronic obstructive pulmonary disease, unspecified: Secondary | ICD-10-CM | POA: Insufficient documentation

## 2022-12-06 DIAGNOSIS — Z79899 Other long term (current) drug therapy: Secondary | ICD-10-CM | POA: Insufficient documentation

## 2022-12-06 DIAGNOSIS — Z923 Personal history of irradiation: Secondary | ICD-10-CM | POA: Diagnosis not present

## 2022-12-06 DIAGNOSIS — C3431 Malignant neoplasm of lower lobe, right bronchus or lung: Secondary | ICD-10-CM | POA: Insufficient documentation

## 2022-12-06 DIAGNOSIS — I700 Atherosclerosis of aorta: Secondary | ICD-10-CM | POA: Insufficient documentation

## 2022-12-06 DIAGNOSIS — J432 Centrilobular emphysema: Secondary | ICD-10-CM | POA: Diagnosis not present

## 2022-12-06 DIAGNOSIS — I2510 Atherosclerotic heart disease of native coronary artery without angina pectoris: Secondary | ICD-10-CM | POA: Diagnosis not present

## 2022-12-06 NOTE — Progress Notes (Signed)
Brendan Lara is here today for follow up post radiation to the lung.  Lung Side: Right, patient completed treatment on 09/12/22  Does the patient complain of any of the following: Pain: No Shortness of breath w/wo exertion: Yes, mostly on exertion.  Cough: Yes, productive.  Hemoptysis: No Pain with swallowing: No Swallowing/choking concerns:  Yes, choking concerns.  Appetite: Poor. Patient drinking boost daily.  Weight:  Wt Readings from Last 3 Encounters:  12/06/22 122 lb 4 oz (55.5 kg)  10/13/22 121 lb 3.2 oz (55 kg)  08/18/22 128 lb 3.2 oz (58.2 kg)      Energy Level: Good Post radiation skin Changes: No    Additional comments if applicable:   BP 120/71 (BP Location: Left Arm, Patient Position: Sitting)   Pulse 78   Temp (!) 97.5 F (36.4 C) (Temporal)   Resp 18   Ht 5\' 11"  (1.803 m)   Wt 122 lb 4 oz (55.5 kg)   SpO2 96%   BMI 17.05 kg/m

## 2022-12-08 ENCOUNTER — Inpatient Hospital Stay (HOSPITAL_BASED_OUTPATIENT_CLINIC_OR_DEPARTMENT_OTHER): Payer: Medicare (Managed Care) | Admitting: Internal Medicine

## 2022-12-08 VITALS — BP 123/77 | HR 98 | Temp 97.6°F | Resp 16 | Ht 71.0 in | Wt 123.1 lb

## 2022-12-08 DIAGNOSIS — C3490 Malignant neoplasm of unspecified part of unspecified bronchus or lung: Secondary | ICD-10-CM | POA: Diagnosis not present

## 2022-12-08 DIAGNOSIS — C3491 Malignant neoplasm of unspecified part of right bronchus or lung: Secondary | ICD-10-CM | POA: Diagnosis not present

## 2022-12-08 DIAGNOSIS — Z923 Personal history of irradiation: Secondary | ICD-10-CM | POA: Diagnosis not present

## 2022-12-08 DIAGNOSIS — Z85118 Personal history of other malignant neoplasm of bronchus and lung: Secondary | ICD-10-CM | POA: Diagnosis present

## 2022-12-08 NOTE — Progress Notes (Signed)
[facility] [phone]   Fax:([phone]) [phone]  OFFICE PROGRESS NOTE  Brendan Joe, MD [address] [place] [address]  DIAGNOSIS: Stage IA (T1b, N0, M0) non-small cell lung cancer, adenocarcinoma presented with a nodule along the major fissure of the right lung involving the posterior right middle lobe and the anterior right lower lobe diagnosed in April 2024. The patient also has few other scattered cystic nonsolid nodules on both lungs that need close monitoring.   Biomarker Findings Microsatellite status - MS-Stable Tumor Mutational Burden - 5 Muts/Mb Genomic Findings For a complete list of the genes assayed, please refer to the Appendix. MTOR L1460P STK11 G196V CDKN2A p16INK4a loss and p14ARF loss exons 2-3 PBRM1 R1160* 8 Disease relevant genes with no reportable alterations: ALK, BRAF, EGFR, ERBB2, KRAS, MET, RET,ROS1  PDL1 TPS 0%  PRIOR THERAPY: Status post curative SBRT to the right lung nodule under the care of Dr. Roselind Messier completed on September 13, 2022  CURRENT THERAPY:  INTERVAL HISTORY: Brendan Lara 67 y.o. male returns to the clinic today for follow-up visit.  The patient is feeling fine today with no concerning complaints except for mild cough.  He tolerated the previous curative SBRT to the right lung nodule fairly well.  He denied having any current chest pain, shortness of breath, cough or hemoptysis.  He has no nausea, vomiting, diarrhea or constipation.  He has no headache or visual changes.  He has no recent weight loss or night sweats.  He had molecular studies by foundation 1 that showed no actionable mutations and negative PD-L1 expression.  He is here today for evaluation with repeat CT scan of the chest for restaging of his disease.  MEDICAL HISTORY: Past Medical History:  Diagnosis Date   Arthritis    Complication of anesthesia    Depression    Dyspnea    History of radiation therapy    Right Lower Lung    History of radiation therapy    Adenocarcinoma of Right Lower Lung- 09/07/2022 - 09/12/2022 - Dr. Antony Blackbird   PONV (postoperative nausea and vomiting)     ALLERGIES:  is allergic to codeine, aspirin, atorvastatin, hydrocodone-acetaminophen, influenza vaccines, other, and tiotropium bromide monohydrate.  MEDICATIONS:  Current Outpatient Medications  Medication Sig Dispense Refill   Fluticasone-Umeclidin-Vilant (TRELEGY ELLIPTA) 100-62.5-25 MCG/ACT AEPB Inhale 1 puff into the lungs every morning. 28 each 1   traZODone (DESYREL) 50 MG tablet Take 50 mg by mouth at bedtime.     No current facility-administered medications for this visit.    SURGICAL HISTORY:  Past Surgical History:  Procedure Laterality Date   BRONCHIAL BIOPSY  06/20/2022   Procedure: BRONCHIAL BIOPSIES;  Surgeon: Leslye Peer, MD;  Location: [facility] ENDOSCOPY;  Service: Pulmonary;;   BRONCHIAL BRUSHINGS  06/20/2022   Procedure: BRONCHIAL BRUSHINGS;  Surgeon: Leslye Peer, MD;  Location: [facility] ENDOSCOPY;  Service: Pulmonary;;   BRONCHIAL NEEDLE ASPIRATION BIOPSY  06/20/2022   Procedure: BRONCHIAL NEEDLE ASPIRATION BIOPSIES;  Surgeon: Leslye Peer, MD;  Location: [facility] ENDOSCOPY;  Service: Pulmonary;;   FACIAL RECONSTRUCTION SURGERY     head surgery     HEMOSTASIS CONTROL  06/20/2022   Procedure: HEMOSTASIS CONTROL;  Surgeon: Leslye Peer, MD;  Location: [facility] ENDOSCOPY;  Service: Pulmonary;;   NECK SURGERY     SHOULDER SURGERY     VIDEO BRONCHOSCOPY WITH RADIAL ENDOBRONCHIAL ULTRASOUND  06/20/2022   Procedure: VIDEO BRONCHOSCOPY WITH RADIAL ENDOBRONCHIAL ULTRASOUND;  Surgeon: Leslye Peer, MD;  Location: [facility] ENDOSCOPY;  Service: Pulmonary;;    REVIEW OF SYSTEMS:  A comprehensive review of systems was negative except for: Respiratory: positive for cough   PHYSICAL EXAMINATION: General appearance: alert, cooperative, and fatigued Head: Normocephalic, without obvious abnormality, atraumatic Neck: no adenopathy, no JVD,  supple, symmetrical, trachea midline, and thyroid not enlarged, symmetric, no tenderness/mass/nodules Lymph nodes: Cervical, supraclavicular, and axillary nodes normal. Resp: clear to auscultation bilaterally Back: symmetric, no curvature. ROM normal. No CVA tenderness. Cardio: regular rate and rhythm, S1, S2 normal, no murmur, click, rub or gallop GI: soft, non-tender; bowel sounds normal; no masses,  no organomegaly Extremities: extremities normal, atraumatic, no cyanosis or edema  ECOG PERFORMANCE STATUS: 1 - Symptomatic but completely ambulatory  Blood pressure 123/77, pulse 98, temperature 97.6 F (36.4 C), temperature source Oral, resp. rate 16, height 5\' 11"  (1.803 m), weight 123 lb 1.6 oz (55.8 kg), SpO2 98%.  LABORATORY DATA: Lab Results  Component Value Date   WBC 6.5 12/01/2022   HGB 16.9 12/01/2022   HCT 49.0 12/01/2022   MCV 94.4 12/01/2022   PLT 192 12/01/2022      Chemistry      Component Value Date/Time   NA 136 12/01/2022 0839   K 4.2 12/01/2022 0839   CL 102 12/01/2022 0839   CO2 30 12/01/2022 0839   BUN 16 12/01/2022 0839   CREATININE 0.86 12/01/2022 0839      Component Value Date/Time   CALCIUM 8.9 12/01/2022 0839   ALKPHOS 64 12/01/2022 0839   AST 16 12/01/2022 0839   ALT 15 12/01/2022 0839   BILITOT 0.6 12/01/2022 0839       RADIOGRAPHIC STUDIES: CT Chest W Contrast  Result Date: 12/05/2022 CLINICAL DATA:  67 year old male with history of non-small cell lung cancer status post radiation therapy. Follow-up study. * Tracking Code: BO * EXAM: CT CHEST WITH CONTRAST TECHNIQUE: Multidetector CT imaging of the chest was performed during intravenous contrast administration. RADIATION DOSE REDUCTION: This exam was performed according to the departmental dose-optimization program which includes automated exposure control, adjustment of the mA and/or kV according to patient size and/or use of iterative reconstruction technique. CONTRAST:  75mL OMNIPAQUE  IOHEXOL 300 MG/ML  SOLN COMPARISON:  Chest CT 06/17/2022.  PET-CT 06/30/2022. FINDINGS: Cardiovascular: Heart size is normal. There is no significant pericardial fluid, thickening or pericardial calcification. There is aortic atherosclerosis, as well as atherosclerosis of the great vessels of the mediastinum and the coronary arteries, including calcified atherosclerotic plaque in the left anterior descending coronary artery. Mediastinum/Nodes: No pathologically enlarged mediastinal or hilar lymph nodes. Esophagus is unremarkable in appearance. No axillary lymphadenopathy. Lungs/Pleura: Treated nodule in the lower right lung straddling the major fissure between the right middle and lower lobes (axial image 121 of series 5) is smaller and appears overall less bulky than the prior examination, currently measuring 1.7 x 1.7 cm. Subtle cystic lesion in the posterior aspect of the left upper lobe (axial image 85 of series 5) measures 2.2 x 2.3 cm and has no definite internal solid component. A similar-appearing cystic lesion is also noted in the left lower lobe (axial image 92 of series 5) measuring 2.2 x 1.2 cm, also with no solid component. 5 mm nodule in the periphery of the inferior segment of the lingula (axial image 133 of series 5), 5 mm right lower lobe pulmonary nodule (axial image 131 of series 5) and 4 mm right middle lobe pulmonary nodule (axial image 30 of series  5 all similar in retrospect compared to prior examinations. Diffuse bronchial wall thickening with mild to moderate centrilobular and paraseptal emphysema. Upper Abdomen: Aortic atherosclerosis. Musculoskeletal: Orthopedic fixation hardware in the lower cervical spine incidentally noted. There are no aggressive appearing lytic or blastic lesions noted in the visualized portions of the skeleton. IMPRESSION: 1. Today's study demonstrates a positive response to therapy with some regression of previously noted lesion in the lower right lung, as detailed  above. Other previously noted solid and subsolid lesions in the lungs are also similar to the prior study. 2. No enlarged mediastinal or hilar lymphadenopathy to suggest nodal metastasis. 3. Diffuse bronchial wall thickening with mild to moderate centrilobular and paraseptal emphysema; imaging findings suggestive of underlying COPD. 4. Aortic atherosclerosis, in addition to left anterior descending coronary artery disease. Please note that although the presence of coronary artery calcium documents the presence of coronary artery disease, the severity of this disease and any potential stenosis cannot be assessed on this non-gated CT examination. Assessment for potential risk factor modification, dietary therapy or pharmacologic therapy may be warranted, if clinically indicated. Aortic Atherosclerosis (ICD10-I70.0) and Emphysema (ICD10-J43.9). Electronically Signed   By: Trudie Reed M.D.   On: 12/05/2022 10:21    ASSESSMENT AND PLAN: This is a very pleasant 67 years old white male with Stage IA (T1b, N0, M0) non-small cell lung cancer, adenocarcinoma presented with a nodule along the major fissure of the right lung involving the posterior right middle lobe and the anterior right lower lobe diagnosed in April 2024. The patient also has few other scattered cystic nonsolid nodules on both lungs that need close monitoring.  Molecular studies by [facility] 1 showed no actionable mutation and negative PD-L1 expression. The patient underwent curative SBRT to the right lung nodule under the care of Dr. Roselind Messier. He tolerated his treatment well. He had repeat CT scan of the chest performed recently.  I personally and independently reviewed the scan and discussed the result with the patient today. His scan showed no concerning findings for disease recurrence or metastasis but there was improvement in the treated right lung nodule. I recommended for him to continue on observation with repeat CT scan of the chest in 6  months. He was advised to call immediately if he has any other concerning symptoms in the interval. The patient voices understanding of current disease status and treatment options and is in agreement with the current care plan.  All questions were answered. The patient knows to call the clinic with any problems, questions or concerns. We can certainly see the patient much sooner if necessary.  The total time spent in the appointment was 20 minutes.  Disclaimer: This note was dictated with voice recognition software. Similar sounding words can inadvertently be transcribed and may not be corrected upon review.

## 2022-12-19 ENCOUNTER — Ambulatory Visit: Payer: Medicare (Managed Care) | Admitting: Pulmonary Disease

## 2022-12-19 ENCOUNTER — Encounter: Payer: Self-pay | Admitting: Pulmonary Disease

## 2022-12-19 VITALS — BP 104/60 | HR 72 | Ht 72.0 in | Wt 125.0 lb

## 2022-12-19 DIAGNOSIS — J449 Chronic obstructive pulmonary disease, unspecified: Secondary | ICD-10-CM | POA: Diagnosis not present

## 2022-12-19 MED ORDER — TRELEGY ELLIPTA 200-62.5-25 MCG/ACT IN AEPB: 1.0000 | INHALATION_SPRAY | Freq: Every day | RESPIRATORY_TRACT | 5 refills | Status: DC

## 2022-12-19 MED ORDER — ALBUTEROL SULFATE HFA 108 (90 BASE) MCG/ACT IN AERS: 2.0000 | INHALATION_SPRAY | Freq: Four times a day (QID) | RESPIRATORY_TRACT | 6 refills | Status: DC | PRN

## 2022-12-19 NOTE — Progress Notes (Signed)
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 Most recent CT shows improvement in nodules  Requires close monitoring with CT scans  We did talk about is continuing to smoke only smokes about 3 cigarettes a day, was smoking about a pack a day usually the importance of quitting smoking was discussed extensively  He is short of breath with mild to moderate exertion Compliant with use of Trelegy  His PFT had shown obstructive disease with significant bronchodilator response   Outpatient Encounter Medications as of 12/19/2022  Medication Sig   Fluticasone-Umeclidin-Vilant (TRELEGY ELLIPTA) 100-62.5-25 MCG/ACT AEPB Inhale 1 puff into the lungs every morning.   traZODone (DESYREL) 50 MG tablet Take 50 mg by mouth at bedtime.   No facility-administered encounter medications on file as of 12/19/2022.    Allergies as of 12/19/2022 - Review Complete 12/19/2022  Allergen Reaction Noted   Codeine Shortness Of Breath and Nausea And Vomiting 08/29/2013   Aspirin Other (See Comments) 08/18/2022   Atorvastatin Other (See Comments) 08/18/2022   Hydrocodone-acetaminophen Other (See Comments) 08/18/2022   Influenza vaccines  07/04/2022   Other Diarrhea and Nausea And Vomiting 06/15/2022   Tiotropium bromide monohydrate  07/04/2022    Past Medical History:  Diagnosis Date   Arthritis    Complication of anesthesia    Depression    Dyspnea    History of radiation therapy    Right Lower Lung   History of radiation therapy    Adenocarcinoma of Right Lower Lung- 09/07/2022 - 09/12/2022 - Dr. Antony Blackbird   PONV (postoperative nausea and vomiting)     Past Surgical  History:  Procedure Laterality Date   BRONCHIAL BIOPSY  06/20/2022   Procedure: BRONCHIAL BIOPSIES;  Surgeon: Leslye Peer, MD;  Location: [facility] ENDOSCOPY;  Service: Pulmonary;;   BRONCHIAL BRUSHINGS  06/20/2022   Procedure: BRONCHIAL BRUSHINGS;  Surgeon: Leslye Peer, MD;  Location: [facility] ENDOSCOPY;  Service: Pulmonary;;   BRONCHIAL NEEDLE ASPIRATION BIOPSY  06/20/2022   Procedure: BRONCHIAL NEEDLE ASPIRATION BIOPSIES;  Surgeon: Leslye Peer, MD;  Location: [facility] ENDOSCOPY;  Service: Pulmonary;;   FACIAL RECONSTRUCTION SURGERY     head surgery     HEMOSTASIS CONTROL  06/20/2022   Procedure: HEMOSTASIS CONTROL;  Surgeon: Leslye Peer, MD;  Location: [facility] ENDOSCOPY;  Service: Pulmonary;;   NECK SURGERY     SHOULDER SURGERY     VIDEO BRONCHOSCOPY WITH RADIAL ENDOBRONCHIAL ULTRASOUND  06/20/2022   Procedure: VIDEO BRONCHOSCOPY WITH RADIAL ENDOBRONCHIAL ULTRASOUND;  Surgeon: Leslye Peer, MD;  Location: [facility] ENDOSCOPY;  Service: Pulmonary;;    Family History  Problem Relation Age of Onset   Thyroid disease Mother    Alzheimer's disease Mother    Heart failure Father     Social History   Socioeconomic History   Marital status: Single    Spouse name: Not on file   Number of children: Not on file   Years of education: Not on file   Highest education level: Not on file  Occupational History   Not on file  Tobacco Use   Smoking status: Every Day    Current packs/day: 0.50    Average packs/day:  0.5 packs/day for 51.0 years (25.5 ttl pk-yrs)    Types: Cigarettes   Smokeless tobacco: Never   Tobacco comments:    Pt states he is down to 1-3 ciggs per day. ALS 07/05/2022  Vaping Use   Vaping status: Never Used  Substance and Sexual Activity   Alcohol use: Not Currently   Drug use: Yes    Types: Marijuana    Comment: once weekly   Sexual activity: Not Currently  Other Topics Concern   Not on file  Social History Narrative   Not on file   Social Determinants of Health   Financial  Resource Strain: Not on file  Food Insecurity: Food Insecurity Present (08/18/2022)   Hunger Vital Sign    Worried About Running Out of Food in the Last Year: Often true    Ran Out of Food in the Last Year: Often true  Transportation Needs: No Transportation Needs (08/18/2022)   PRAPARE - Administrator, Civil Service (Medical): No    Lack of Transportation (Non-Medical): No  Physical Activity: Not on file  Stress: Not on file  Social Connections: Not on file  Intimate Partner Violence: Not At Risk (08/18/2022)   Humiliation, Afraid, Rape, and Kick questionnaire    Fear of Current or Ex-Partner: No    Emotionally Abused: No    Physically Abused: No    Sexually Abused: No    Review of Systems  Constitutional:  Positive for fatigue.  Respiratory:  Positive for shortness of breath.     Vitals:   12/19/22 1022  BP: 104/60  Pulse: 72  SpO2: 93%     Physical Exam Constitutional:      Appearance: Normal appearance.  HENT:     Head: Normocephalic.     Mouth/Throat:     Mouth: Mucous membranes are moist.  Eyes:     General: No scleral icterus. Cardiovascular:     Rate and Rhythm: Normal rate and regular rhythm.  Pulmonary:     Effort: No respiratory distress.     Breath sounds: No stridor. No wheezing or rhonchi.  Musculoskeletal:     Cervical back: No rigidity or tenderness.  Neurological:     Mental Status: He is alert.  Psychiatric:        Mood and Affect: Mood normal.      Data Reviewed: Most recent CT scan of the chest reviewed by myself  PFT reviewed showing severe obstructive disease with significant bronchodilator response  Assessment:  Active smoker -Smoking cessation counseling  Severe obstructive disease  Adenocarcinoma of the lung -S/p stereotactic radiation  Abnormal CT scan of the chest -Radiological monitoring  Deconditioning -Graded activities as tolerated  Plan/Recommendations: Smoking cessation counseling  Increase  Trelegy from 100 to Trelegy 200  Graded activities as tolerated  Continue follow-up with oncology  Radiological monitoring of lung nodule and cystic changes  Prescription for albuterol to be used as needed  Encouraged to call with significant concerns   Virl Diamond MD [facility] 12/19/2022, 10:43 AM  CC: [name], MD

## 2022-12-19 NOTE — Patient Instructions (Signed)
Continue working on quitting smoking completely  Regular exercises will help your activity tolerance  Dose of your Trelegy increased to 200 from 100, you need to use this daily  Prescription for a rescue inhaler that he can use up to 4 times a day if you are unusually short of breath also sent to pharmacy  Call us with significant concerns  Will see you in about 3 months

## 2023-04-13 ENCOUNTER — Telehealth: Payer: Self-pay | Admitting: *Deleted

## 2023-04-13 MED ORDER — TRELEGY ELLIPTA 200-62.5-25 MCG/ACT IN AEPB: 1.0000 | INHALATION_SPRAY | Freq: Every day | RESPIRATORY_TRACT | 5 refills | Status: DC

## 2023-04-13 NOTE — Telephone Encounter (Signed)
[facility] [address]   Pt states [facility] no longer carry's Trelegy. [facility].Coughing and out of med.

## 2023-04-13 NOTE — Telephone Encounter (Signed)
Called and spoke with patient, he states he has been out of his Trelegy for 4 days and he is coughing.  Advised him that I would send in [facility] as [facility].  I advised him to call us back if the cough did not improve after starting his Trelegy or if he developed any further symptoms or coughing up any colored mucous.  He verbalized understanding.

## 2023-05-08 ENCOUNTER — Telehealth: Payer: Self-pay | Admitting: Internal Medicine

## 2023-05-08 NOTE — Telephone Encounter (Signed)
[facility] Clinical Research Coordinator note:   This visit for Subject Brendan Lara with DOB: 31-Jul-1955 on 05/08/2023. Subject contacted regarding [id number] to inform that Principal Investigator has changed to Dr. Delton Coombes. Voicemail left requesting a return call.

## 2023-05-31 ENCOUNTER — Ambulatory Visit (HOSPITAL_COMMUNITY)
Admission: RE | Admit: 2023-05-31 | Discharge: 2023-05-31 | Disposition: A | Discharge status: Home / Self Care | Payer: Medicare (Managed Care) | Source: Ambulatory Visit | Attending: Internal Medicine | Admitting: Internal Medicine

## 2023-05-31 ENCOUNTER — Inpatient Hospital Stay: Payer: Medicare (Managed Care) | Attending: Internal Medicine

## 2023-05-31 DIAGNOSIS — C3490 Malignant neoplasm of unspecified part of unspecified bronchus or lung: Secondary | ICD-10-CM | POA: Insufficient documentation

## 2023-05-31 DIAGNOSIS — Z85118 Personal history of other malignant neoplasm of bronchus and lung: Secondary | ICD-10-CM | POA: Insufficient documentation

## 2023-05-31 DIAGNOSIS — Z923 Personal history of irradiation: Secondary | ICD-10-CM | POA: Insufficient documentation

## 2023-05-31 LAB — CMP (CANCER CENTER ONLY)
ALT: 14 U/L (ref 0–44)
AST: 18 U/L (ref 15–41)
Albumin: 4.5 g/dL (ref 3.5–5.0)
Alkaline Phosphatase: 75 U/L (ref 38–126)
Anion gap: 6 (ref 5–15)
BUN: 16 mg/dL (ref 8–23)
CO2: 29 mmol/L (ref 22–32)
Calcium: 9.2 mg/dL (ref 8.9–10.3)
Chloride: 101 mmol/L (ref 98–111)
Creatinine: 0.98 mg/dL (ref 0.61–1.24)
GFR, Estimated: >60 mL/min (ref >60)
Glucose, Bld: 106 mg/dL — ABNORMAL HIGH (ref 70–99)
Potassium: 4.4 mmol/L (ref 3.5–5.1)
Sodium: 136 mmol/L (ref 135–145)
Total Bilirubin: 0.9 mg/dL (ref 0.0–1.2)
Total Protein: 7.3 g/dL (ref 6.5–8.1)

## 2023-05-31 LAB — CBC WITH DIFFERENTIAL (CANCER CENTER ONLY)
Abs Immature Granulocytes: 0.03 10*3/uL (ref 0.00–0.07)
Basophils Absolute: 0.1 10*3/uL (ref 0.0–0.1)
Basophils Relative: 1 %
Eosinophils Absolute: 0.1 10*3/uL (ref 0.0–0.5)
Eosinophils Relative: 2 %
HCT: 51.8 % (ref 39.0–52.0)
Hemoglobin: 18 g/dL — ABNORMAL HIGH (ref 13.0–17.0)
Immature Granulocytes: 0 %
Lymphocytes Relative: 23 %
Lymphs Abs: 1.7 10*3/uL (ref 0.7–4.0)
MCH: 32 pg (ref 26.0–34.0)
MCHC: 34.7 g/dL (ref 30.0–36.0)
MCV: 92.2 fL (ref 80.0–100.0)
Monocytes Absolute: 0.8 10*3/uL (ref 0.1–1.0)
Monocytes Relative: 10 %
Neutro Abs: 4.9 10*3/uL (ref 1.7–7.7)
Neutrophils Relative %: 64 %
Platelet Count: 178 10*3/uL (ref 150–400)
RBC: 5.62 MIL/uL (ref 4.22–5.81)
RDW: 12.9 % (ref 11.5–15.5)
WBC Count: 7.6 10*3/uL (ref 4.0–10.5)
nRBC: 0 % (ref 0.0–0.2)

## 2023-05-31 MED ORDER — IOHEXOL 300 MG/ML  SOLN
75.0000 mL | Freq: Once | INTRAMUSCULAR | Status: AC | PRN
  Administered 2023-05-31: 75 mL via INTRAVENOUS

## 2023-06-02 ENCOUNTER — Telehealth: Payer: Self-pay | Admitting: Physician Assistant

## 2023-06-02 NOTE — Telephone Encounter (Signed)
 Rescheduled appointment per patient request. The patient is aware of the changes made.

## 2023-06-08 ENCOUNTER — Ambulatory Visit: Payer: Medicare (Managed Care) | Admitting: Pulmonary Disease

## 2023-06-08 ENCOUNTER — Ambulatory Visit: Payer: Medicare (Managed Care) | Admitting: Internal Medicine

## 2023-06-12 NOTE — Progress Notes (Signed)
 [facility] OFFICE PROGRESS NOTE  [facility] [address] [place] [address]  DIAGNOSIS: Stage IA (T1b, N0, M0) non-small cell lung cancer, adenocarcinoma presented with a nodule along the major fissure of the right lung involving the posterior right middle lobe and the anterior right lower lobe diagnosed in April 2024. The patient also has few other scattered cystic nonsolid nodules on both lungs that need close monitoring.    Biomarker Findings Microsatellite status - MS-Stable Tumor Mutational Burden - 5 Muts/Mb Genomic Findings For a complete list of the genes assayed, please refer to the Appendix. MTOR L1460P STK11 G196V CDKN2A p16INK4a loss and p14ARF loss exons 2-3 PBRM1 R1160* 8 Disease relevant genes with no reportable alterations: ALK, BRAF, EGFR, ERBB2, KRAS, MET, RET,ROS1   PDL1 TPS 0%  PRIOR THERAPY: Status post curative SBRT to the right lung nodule under the care of Dr. Roselind Messier completed on September 13, 2022   CURRENT THERAPY: Observation   INTERVAL HISTORY: Brendan Lara 68 y.o. male returns to the clinic today for a follow-up visit.  The patient was last seen 6 months ago by Dr. Arbutus Ped.  In the interval since being seen, the patient recently moved to [place], [place]. The patient received SBRT to a right lung nodule in June 2024.  He denies any major changes in his health since he was last seen.  He denies any recent infections.  He denies any recent aspiration.  He states that he "every now and then" he may have trouble or swallow incorrectly but cannot recall anything out of the ordinary recently.  He denies any fever, chills, night sweats, or unexplained weight loss.  He denies any chest pain or hemoptysis. He sees Dr. Wynona Neat from pulmonary medicine.  Reports he gets intermittent dyspnea on exertion but reports some days are better than others.  He denies any unusual shortness of breath.  He reports he may  have a intermittent cough which produces white phlegm but he reports this is also not new.  He denies any nausea, vomiting, or diarrhea.  He has been struggling with constipation since he was young.  He does not take anything for this.  Denies any headache or visual changes.  He recently had a restaging CT scan.  He is here today for evaluation and to review his scan results.   MEDICAL HISTORY: Past Medical History:  Diagnosis Date   Arthritis    Complication of anesthesia    Depression    Dyspnea    History of radiation therapy    Right Lower Lung   History of radiation therapy    Adenocarcinoma of Right Lower Lung- 09/07/2022 - 09/12/2022 - Dr. Antony Blackbird   PONV (postoperative nausea and vomiting)     ALLERGIES:  is allergic to codeine, aspirin, atorvastatin, hydrocodone-acetaminophen, influenza vaccines, other, and tiotropium bromide monohydrate.  MEDICATIONS:  Current Outpatient Medications  Medication Sig Dispense Refill   doxycycline (VIBRA-TABS) 100 MG tablet Take 1 tablet (100 mg total) by mouth 2 (two) times daily. 14 tablet 0   albuterol (VENTOLIN HFA) 108 (90 Base) MCG/ACT inhaler Inhale 2 puffs into the lungs every 6 (six) hours as needed for wheezing or shortness of breath. 8 g 6   Fluticasone-Umeclidin-Vilant (TRELEGY ELLIPTA) 100-62.5-25 MCG/ACT AEPB Inhale 1 puff into the lungs every morning. 28 each 1   Fluticasone-Umeclidin-Vilant (TRELEGY ELLIPTA) 200-62.5-25 MCG/ACT AEPB Inhale 1 puff into the lungs daily. 60 each 5   traZODone (DESYREL)  50 MG tablet Take 50 mg by mouth at bedtime.     No current facility-administered medications for this visit.    SURGICAL HISTORY:  Past Surgical History:  Procedure Laterality Date   BRONCHIAL BIOPSY  06/20/2022   Procedure: BRONCHIAL BIOPSIES;  Surgeon: Leslye Peer, MD;  [facility] ENDOSCOPY;  Service: Pulmonary;;   BRONCHIAL BRUSHINGS  06/20/2022   Procedure: BRONCHIAL BRUSHINGS;  Surgeon: Leslye Peer, MD;  Location:  [facility] ENDOSCOPY;  Service: Pulmonary;;   BRONCHIAL NEEDLE ASPIRATION BIOPSY  06/20/2022   Procedure: BRONCHIAL NEEDLE ASPIRATION BIOPSIES;  Surgeon: Leslye Peer, MD;  Location: [facility] ENDOSCOPY;  Service: Pulmonary;;   FACIAL RECONSTRUCTION SURGERY     head surgery     HEMOSTASIS CONTROL  06/20/2022   Procedure: HEMOSTASIS CONTROL;  Surgeon: Leslye Peer, MD;  Location: [facility] ENDOSCOPY;  Service: Pulmonary;;   NECK SURGERY     SHOULDER SURGERY     VIDEO BRONCHOSCOPY WITH RADIAL ENDOBRONCHIAL ULTRASOUND  06/20/2022   Procedure: VIDEO BRONCHOSCOPY WITH RADIAL ENDOBRONCHIAL ULTRASOUND;  Surgeon: Leslye Peer, MD;  Location: [facility] ENDOSCOPY;  Service: Pulmonary;;    REVIEW OF SYSTEMS:   Review of Systems  Constitutional: Negative for appetite change, chills, fatigue, fever and unexpected weight change.  HENT: Negative for mouth sores, nosebleeds, sore throat and trouble swallowing.   Eyes: Negative for eye problems and icterus.  Respiratory: Positive for intermittent shortness of breath with exertion and intermittent cough.  Negative for hemoptysis and wheezing.   Cardiovascular: Negative for chest pain and leg swelling.  Gastrointestinal: Positive for frequent constipation. Negative for abdominal pain, diarrhea, nausea and vomiting.  Genitourinary: Negative for bladder incontinence, difficulty urinating, dysuria, frequency and hematuria.   Musculoskeletal: Negative for back pain, gait problem, neck pain and neck stiffness.  Skin: Negative for itching and rash.  Neurological: Negative for dizziness, extremity weakness, gait problem, headaches, light-headedness and seizures.  Hematological: Negative for adenopathy. Does not bruise/bleed easily.  Psychiatric/Behavioral: Negative for confusion, depression and sleep disturbance. The patient is not nervous/anxious.     PHYSICAL EXAMINATION:  Blood pressure 121/75, pulse 64, temperature 97.8 F (36.6 C), temperature source Temporal, resp. rate 13, weight  126 lb 12.8 oz (57.5 kg), SpO2 98%.  ECOG PERFORMANCE STATUS: 1  Physical Exam  Constitutional: Oriented to person, place, and time and thin appearing male and in no distress.  HENT:  Head: Normocephalic and atraumatic.  Mouth/Throat: Oropharynx is clear and moist. No oropharyngeal exudate.  Eyes: Conjunctivae are normal. Right eye exhibits no discharge. Left eye exhibits no discharge. No scleral icterus.  Neck: Normal range of motion. Neck supple.  Cardiovascular: Normal rate, regular rhythm, normal heart sounds and intact distal pulses.   Pulmonary/Chest: Effort normal. Quiet breath sounds bilaterally. No respiratory distress. No wheezes. No rales.  Abdominal: Soft. Bowel sounds are normal. Exhibits no distension and no mass. There is no tenderness.  Musculoskeletal: Normal range of motion. Exhibits no edema.  Lymphadenopathy:    No cervical adenopathy.  Neurological: Alert and oriented to person, place, and time. Exhibits muscle wasting. Gait normal. Coordination normal.  Skin: Skin is warm and dry. No rash noted. Not diaphoretic. No erythema. No pallor.  Psychiatric: Mood, memory and judgment normal.  Vitals reviewed.  LABORATORY DATA: Lab Results  Component Value Date   WBC 7.6 05/31/2023   HGB 18.0 (H) 05/31/2023   HCT 51.8 05/31/2023   MCV 92.2 05/31/2023   PLT 178 05/31/2023      Chemistry  Component Value Date/Time   NA 136 05/31/2023 0856   K 4.4 05/31/2023 0856   CL 101 05/31/2023 0856   CO2 29 05/31/2023 0856   BUN 16 05/31/2023 0856   CREATININE 0.98 05/31/2023 0856      Component Value Date/Time   CALCIUM 9.2 05/31/2023 0856   ALKPHOS 75 05/31/2023 0856   AST 18 05/31/2023 0856   ALT 14 05/31/2023 0856   BILITOT 0.9 05/31/2023 0856       RADIOGRAPHIC STUDIES:  CT Chest W Contrast Result Date: 06/15/2023 CLINICAL DATA:  Stage I A non-small cell lung cancer. Adenocarcinoma within a nodule involving the right middle and right lower lobes.  Diagnosed last April. Prior radiation therapy. * Tracking Code: BO * EXAM: CT CHEST WITH CONTRAST TECHNIQUE: Multidetector CT imaging of the chest was performed during intravenous contrast administration. RADIATION DOSE REDUCTION: This exam was performed according to the departmental dose-optimization program which includes automated exposure control, adjustment of the mA and/or kV according to patient size and/or use of iterative reconstruction technique. CONTRAST:  75mL OMNIPAQUE IOHEXOL 300 MG/ML  SOLN COMPARISON:  12/01/2022 FINDINGS: Cardiovascular: Aortic atherosclerosis. Normal heart size, without pericardial effusion. Lad coronary artery calcification. No central pulmonary embolism, on this non-dedicated study. Mediastinum/Nodes: No mediastinal or hilar adenopathy. Lungs/Pleura: No pleural fluid. Spiculated nodule along the right major fissure measures 1.5 x 1.0 cm on 124/6 versus 1.7 x 1.7 cm on the prior exam. 1.4 cm craniocaudal on coronal image 57 versus 1.8 cm on the prior exam (when remeasured). Moderate centrilobular emphysema. Left upper lobe mixed attenuation 2.1 x 2.2 cm nodule on 82/6 is relatively similar to the prior where it measured 2.2 x 2.3 cm. Medial left lower lobe sub solid nodule measures 2.2 x 1.4 cm on 91/6 and is similar to 2.2 x 1.2 cm on the prior. Some solid tiny nodules are unchanged. Example 5 mm in the subpleural right lower lobe on 134/6, 3 mm in the left lower lobe on 138/6. New dependent nodular consolidation with surrounding ground-glass in the right lower lobe including on 149/6. New solid nodular densities. Example posterior right upper lobe at 8 mm on 33/6. Anterolateral subpleural left upper lobe at 9 mm on 37/6. More caudal anterior left upper lobe at 4 mm on 76/6. Upper Abdomen: Tiny low-density liver lesions are too small to characterize but similar and likely cysts. Normal imaged portions of the spleen, stomach, pancreas, adrenal glands, kidneys. Musculoskeletal:  Lower cervical spine fixation. Right rotator cuff repair. IMPRESSION: 1. Response to therapy of the primary centered about the major fissure, involving the right lower and right middle lobes. 2. New right lower lobe nodular consolidation, favoring infection or aspiration. 3. Development of multiple bilateral nodular densities, upper lobe predominant. Although these could also represent infection/inflammation, metastatic disease or developing metachronous primaries cannot be excluded. Consider antibiotic therapy and chest CT follow-up at 2-3 months. 4. No thoracic adenopathy. 5. The sub solid pulmonary nodules are unchanged. 6. Aortic atherosclerosis (ICD10-I70.0), coronary artery atherosclerosis and emphysema (ICD10-J43.9). Electronically Signed   By: Jeronimo Greaves M.D.   On: 06/15/2023 13:53     ASSESSMENT/PLAN:  This is a very pleasant 67 year old Caucasian male with stage I (T1b, N0, M0) non-small cell lung cancer, adenocarcinoma.  The patient presented with a lung nodule along the major fissure of the right lung involving the posterior right middle lobe and anterior right lower lobe.  He was diagnosed in April 2024.  He also has a few other scattered cystic nonsolid  nodules in both lungs that need close monitoring.  Molecular studies by foundation 1 showed no actionable mutations and negative PD-L1 expression.  He underwent curative SBRT to the right lung nodule under the care of Dr. Roselind Messier which was completed in June 2024.  The patient recently had a restaging CT scan.  The patient was seen with Dr. Arbutus Ped today.  Dr. Arbutus Ped personally and independently reviewed the scan and discussed results with the patient today.  The scan showed response to therapy in the right lower and right middle lobes.  However there is new nodular consolidation favoring infection or aspiration and multiple bilateral nodular densities in the upper lobe which could be infectious and inflammation but cannot exclude metastatic  disease or developing metachronous. Therefore, we will prescribe doxycycline for a few days and do a short interval follow up scan.   Dr. Arbutus Ped recommends he continue on observation with a restaging CT scan in 3 months.   The patient was advised to call immediately if he has any concerning symptoms in the interval. The patient voices understanding of current disease status and treatment options and is in agreement with the current care plan. All questions were answered. The patient knows to call the clinic with any problems, questions or concerns. We can certainly see the patient much sooner if necessary    Orders Placed This Encounter  Procedures   CT Chest W Contrast    Standing Status:   Future    Expected Date:   09/15/2023    Expiration Date:   06/14/2024    If indicated for the ordered procedure, I authorize the administration of contrast media per Radiology protocol:   Yes    Does the patient have a contrast media/X-ray dye allergy?:   No    Preferred imaging location?:   [facility]   CBC with Differential (Cancer Center Only)    Standing Status:   Future    Expected Date:   09/15/2023    Expiration Date:   06/14/2024   CMP (Cancer Center only)    Standing Status:   Future    Expected Date:   09/15/2023    Expiration Date:   06/14/2024      Johnette Abraham Zeynab Klett, PA-C 06/15/23  ADDENDUM: Hematology/Oncology Attending:  I had a face-to-face encounter with the patient.  I reviewed his record, lab, scan and recommended his care plan.  This is a very pleasant 68 years old white male with a stage Ia non-small cell lung cancer, adenocarcinoma diagnosed in April 2024 he is status post curative SBRT in June 2024 and currently on observation.  The patient is doing fine with no concerning complaints except for intermittent shortness of breath especially with exertion.  He had CT scan of the chest performed recently and that showed response to therapy of the primary centered  right lower and right middle lobes but there was new right lower lobe nodular consolidation favoring infection or aspiration in addition to development of multiple bilateral pulmonary densities in the upper lobes again still suspicious for infectious/inflammatory process but metastatic disease or developing metachronous primary could not be completely excluded. I recommended for the patient to continue on observation with repeat CT scan of the chest in around 3 months for restaging of his disease.  Will start the patient on treatment with doxycycline for the suspicious inflammatory/infectious process. He was advised to call immediately if he has any other concerning symptoms in the interval. The total time spent in the appointment  was 20 minutes. Disclaimer: This note was dictated with voice recognition software. Similar sounding words can inadvertently be transcribed and may be missed upon review. Lajuana Matte, MD

## 2023-06-15 ENCOUNTER — Inpatient Hospital Stay: Payer: Medicare (Managed Care) | Admitting: Physician Assistant

## 2023-06-15 VITALS — BP 121/75 | HR 64 | Temp 97.8°F | Resp 13 | Wt 126.8 lb

## 2023-06-15 DIAGNOSIS — Z85118 Personal history of other malignant neoplasm of bronchus and lung: Secondary | ICD-10-CM | POA: Diagnosis not present

## 2023-06-15 DIAGNOSIS — C3491 Malignant neoplasm of unspecified part of right bronchus or lung: Secondary | ICD-10-CM | POA: Diagnosis not present

## 2023-06-15 MED ORDER — DOXYCYCLINE HYCLATE 100 MG PO TABS
100.0000 mg | ORAL_TABLET | Freq: Two times a day (BID) | ORAL | 0 refills | Status: AC
Start: 2023-06-15 — End: ?

## 2023-07-17 ENCOUNTER — Ambulatory Visit: Payer: Medicare (Managed Care) | Admitting: Adult Health

## 2023-09-07 ENCOUNTER — Telehealth: Payer: Self-pay | Admitting: Medical Oncology

## 2023-09-07 NOTE — Telephone Encounter (Signed)
 Pt said he had a CT scan recently in [place] and dx with pneumonia. He completed antibiotic treatment recently .I  requested his records . I will contact pt with information re: CT scan scheduled.

## 2023-09-14 ENCOUNTER — Other Ambulatory Visit: Payer: Medicare (Managed Care)

## 2023-09-14 ENCOUNTER — Ambulatory Visit (HOSPITAL_COMMUNITY): Admission: RE | Admit: 2023-09-14 | Payer: Medicare (Managed Care) | Source: Ambulatory Visit

## 2023-09-14 ENCOUNTER — Encounter (HOSPITAL_COMMUNITY): Payer: Self-pay

## 2023-09-26 ENCOUNTER — Telehealth: Payer: Self-pay | Admitting: Internal Medicine

## 2023-09-26 NOTE — Telephone Encounter (Signed)
 Left a voicemail with the rescheduled appointment details per provider out of office.

## 2023-09-28 ENCOUNTER — Inpatient Hospital Stay: Payer: Medicare (Managed Care)

## 2023-09-28 ENCOUNTER — Inpatient Hospital Stay: Payer: Medicare (Managed Care) | Admitting: Internal Medicine

## 2023-10-17 ENCOUNTER — Inpatient Hospital Stay: Payer: Medicare (Managed Care) | Attending: Physician Assistant

## 2023-10-17 ENCOUNTER — Inpatient Hospital Stay (HOSPITAL_BASED_OUTPATIENT_CLINIC_OR_DEPARTMENT_OTHER): Payer: Medicare (Managed Care) | Admitting: Internal Medicine

## 2023-10-17 VITALS — BP 128/79 | HR 68 | Temp 98.1°F | Resp 17 | Ht 72.0 in | Wt 123.0 lb

## 2023-10-17 DIAGNOSIS — C342 Malignant neoplasm of middle lobe, bronchus or lung: Secondary | ICD-10-CM | POA: Diagnosis not present

## 2023-10-17 DIAGNOSIS — C3490 Malignant neoplasm of unspecified part of unspecified bronchus or lung: Secondary | ICD-10-CM

## 2023-10-17 DIAGNOSIS — C3491 Malignant neoplasm of unspecified part of right bronchus or lung: Secondary | ICD-10-CM | POA: Diagnosis present

## 2023-10-17 LAB — CMP (CANCER CENTER ONLY)
ALT: 11 U/L (ref 0–44)
AST: 15 U/L (ref 15–41)
Albumin: 4.2 g/dL (ref 3.5–5.0)
Alkaline Phosphatase: 69 U/L (ref 38–126)
Anion gap: 5 (ref 5–15)
BUN: 16 mg/dL (ref 8–23)
CO2: 30 mmol/L (ref 22–32)
Calcium: 9.3 mg/dL (ref 8.9–10.3)
Chloride: 101 mmol/L (ref 98–111)
Creatinine: 1.02 mg/dL (ref 0.61–1.24)
GFR, Estimated: >60 mL/min (ref >60)
Glucose, Bld: 104 mg/dL — ABNORMAL HIGH (ref 70–99)
Potassium: 4.5 mmol/L (ref 3.5–5.1)
Sodium: 136 mmol/L (ref 135–145)
Total Bilirubin: 0.7 mg/dL (ref 0.0–1.2)
Total Protein: 7 g/dL (ref 6.5–8.1)

## 2023-10-17 LAB — CBC WITH DIFFERENTIAL (CANCER CENTER ONLY)
Abs Immature Granulocytes: 0.02 K/uL (ref 0.00–0.07)
Basophils Absolute: 0 K/uL (ref 0.0–0.1)
Basophils Relative: 1 %
Eosinophils Absolute: 0.1 K/uL (ref 0.0–0.5)
Eosinophils Relative: 2 %
HCT: 47.1 % (ref 39.0–52.0)
Hemoglobin: 16.5 g/dL (ref 13.0–17.0)
Immature Granulocytes: 0 %
Lymphocytes Relative: 22 %
Lymphs Abs: 1.4 K/uL (ref 0.7–4.0)
MCH: 32.4 pg (ref 26.0–34.0)
MCHC: 35 g/dL (ref 30.0–36.0)
MCV: 92.5 fL (ref 80.0–100.0)
Monocytes Absolute: 0.7 K/uL (ref 0.1–1.0)
Monocytes Relative: 11 %
Neutro Abs: 4.1 K/uL (ref 1.7–7.7)
Neutrophils Relative %: 64 %
Platelet Count: 190 K/uL (ref 150–400)
RBC: 5.09 MIL/uL (ref 4.22–5.81)
RDW: 13.4 % (ref 11.5–15.5)
WBC Count: 6.4 K/uL (ref 4.0–10.5)
nRBC: 0 % (ref 0.0–0.2)

## 2023-10-17 NOTE — Progress Notes (Signed)
 [facility] [phone]   [phone]  OFFICE PROGRESS NOTE  [facility] [name] [place] [address]  DIAGNOSIS: Stage IA (T1b, N0, M0) non-small cell lung cancer, adenocarcinoma presented with a nodule along the major fissure of the right lung involving the posterior right middle lobe and the anterior right lower lobe diagnosed in April 2024. The patient also has few other scattered cystic nonsolid nodules on both lungs that need close monitoring.   Biomarker Findings Microsatellite status - MS-Stable Tumor Mutational Burden - 5 Muts/Mb Genomic Findings For a complete list of the genes assayed, please refer to the Appendix. MTOR L1460P STK11 G196V CDKN2A p16INK4a loss and p14ARF loss exons 2-3 PBRM1 R1160* 8 Disease relevant genes with no reportable alterations: ALK, BRAF, EGFR, ERBB2, KRAS, MET, RET,ROS1  PDL1 TPS 0%  PRIOR THERAPY: Status post curative SBRT to the right lung nodule under the care of Dr. Shannon completed on September 13, 2022  CURRENT THERAPY: Observation  INTERVAL HISTORY: Brendan Lara 68 y.o. male returns to the clinic today for follow-up visit.Discussed the use of AI scribe software for clinical note transcription with the patient, who gave verbal consent to proceed.  History of Present Illness Brendan Lara is a 67 year old male with Stage 1 non-small cell lung cancer who presents for evaluation after repeat imaging studies.  He was diagnosed with Stage 1 non-small cell lung cancer in April 2024, characterized by no actionable mutation and negative PD-L1 expression. He underwent curative stereotactic body radiation therapy (SBRT) to a right lung nodule and has been on observation since that time.  He is here for evaluation following repeat imaging studies conducted in June 2025 at [facility] in [place], [place] . No new symptoms such as chest pain, breathing  issues, nausea, vomiting, or diarrhea have been reported since his last visit.  He currently resides in [place],  , and has a pulmonologist at [facility] on [address]. He uses inhalers, although the specific type and frequency were not discussed.    MEDICAL HISTORY: Past Medical History:  Diagnosis Date   Arthritis    Complication of anesthesia    Depression    Dyspnea    History of radiation therapy    Right Lower Lung   History of radiation therapy    Adenocarcinoma of Right Lower Lung- 09/07/2022 - 09/12/2022 - Dr. Lynwood Shannon   PONV (postoperative nausea and vomiting)     ALLERGIES:  is allergic to codeine, aspirin, atorvastatin, hydrocodone-acetaminophen , influenza vaccines, other, and tiotropium bromide monohydrate.  MEDICATIONS:  Current Outpatient Medications  Medication Sig Dispense Refill   albuterol  (VENTOLIN  HFA) 108 (90 Base) MCG/ACT inhaler Inhale 2 puffs into the lungs every 6 (six) hours as needed for wheezing or shortness of breath. 8 g 6   doxycycline  (VIBRA -TABS) 100 MG tablet Take 1 tablet (100 mg total) by mouth 2 (two) times daily. 14 tablet 0   Fluticasone-Umeclidin-Vilant (TRELEGY ELLIPTA ) 100-62.5-25 MCG/ACT AEPB Inhale 1 puff into the lungs every morning. 28 each 1   Fluticasone-Umeclidin-Vilant (TRELEGY ELLIPTA ) 200-62.5-25 MCG/ACT AEPB Inhale 1 puff into the lungs daily. 60 each 5   traZODone (DESYREL) 50 MG tablet Take 50 mg by mouth at bedtime.     No current facility-administered medications for this visit.    SURGICAL HISTORY:  Past Surgical History:  Procedure Laterality Date   BRONCHIAL BIOPSY  06/20/2022   Procedure: BRONCHIAL BIOPSIES;  Surgeon: Shelah Lamar RAMAN, MD;  Location: [facility] ENDOSCOPY;  Service: Pulmonary;;   BRONCHIAL BRUSHINGS  06/20/2022   Procedure: BRONCHIAL BRUSHINGS;  Surgeon: Shelah Lamar RAMAN, MD;  Location: [facility] ENDOSCOPY;  Service: Pulmonary;;   BRONCHIAL NEEDLE ASPIRATION BIOPSY  06/20/2022   Procedure:  BRONCHIAL NEEDLE ASPIRATION BIOPSIES;  Surgeon: Shelah Lamar RAMAN, MD;  Location: [facility] ENDOSCOPY;  Service: Pulmonary;;   FACIAL RECONSTRUCTION SURGERY     head surgery     HEMOSTASIS CONTROL  06/20/2022   Procedure: HEMOSTASIS CONTROL;  Surgeon: Shelah Lamar RAMAN, MD;  Location: [facility] ENDOSCOPY;  Service: Pulmonary;;   NECK SURGERY     SHOULDER SURGERY     VIDEO BRONCHOSCOPY WITH RADIAL ENDOBRONCHIAL ULTRASOUND  06/20/2022   Procedure: VIDEO BRONCHOSCOPY WITH RADIAL ENDOBRONCHIAL ULTRASOUND;  Surgeon: Shelah Lamar RAMAN, MD;  Location: [facility] ENDOSCOPY;  Service: Pulmonary;;    REVIEW OF SYSTEMS:  A comprehensive review of systems was negative except for: Respiratory: positive for cough and wheezing   PHYSICAL EXAMINATION: General appearance: alert, cooperative, and fatigued Head: Normocephalic, without obvious abnormality, atraumatic Neck: no adenopathy, no JVD, supple, symmetrical, trachea midline, and thyroid not enlarged, symmetric, no tenderness/mass/nodules Lymph nodes: Cervical, supraclavicular, and axillary nodes normal. Resp: wheezes bilaterally Back: symmetric, no curvature. ROM normal. No CVA tenderness. Cardio: regular rate and rhythm, S1, S2 normal, no murmur, click, rub or gallop GI: soft, non-tender; bowel sounds normal; no masses,  no organomegaly Extremities: extremities normal, atraumatic, no cyanosis or edema  ECOG PERFORMANCE STATUS: 1 - Symptomatic but completely ambulatory  Blood pressure 128/79, pulse 68, temperature 98.1 F (36.7 C), temperature source Temporal, resp. rate 17, height 6' (1.829 m), weight 123 lb (55.8 kg), SpO2 100%.  LABORATORY DATA: Lab Results  Component Value Date   WBC 6.4 10/17/2023   HGB 16.5 10/17/2023   HCT 47.1 10/17/2023   MCV 92.5 10/17/2023   PLT 190 10/17/2023      Chemistry      Component Value Date/Time   NA 136 10/17/2023 1055   K 4.5 10/17/2023 1055   CL 101 10/17/2023 1055   CO2 30 10/17/2023 1055   BUN 16 10/17/2023 1055    CREATININE 1.02 10/17/2023 1055      Component Value Date/Time   CALCIUM 9.3 10/17/2023 1055   ALKPHOS 69 10/17/2023 1055   AST 15 10/17/2023 1055   ALT 11 10/17/2023 1055   BILITOT 0.7 10/17/2023 1055       RADIOGRAPHIC STUDIES: No results found.  ASSESSMENT AND PLAN: This is a very pleasant 68 years old white male with Stage IA (T1b, N0, M0) non-small cell lung cancer, adenocarcinoma presented with a nodule along the major fissure of the right lung involving the posterior right middle lobe and the anterior right lower lobe diagnosed in April 2024. The patient also has few other scattered cystic nonsolid nodules on both lungs that need close monitoring.  Molecular studies by foundation 1 showed no actionable mutation and negative PD-L1 expression. The patient underwent curative SBRT to the right lung nodule under the care of Dr. Shannon. The patient is currently on observation.  He had repeat CT scan of the chest in outside facility in [facility] [place]  and that showed bilateral pulmonary nodules but no comparison to the previous scan. Assessment and Plan Assessment & Plan Stage 1 non-small cell lung cancer, right lung Post-curative stereotactic body radiation therapy (SPRT) with no actionable mutation and negative PD-L1 expression. Currently under surveillance. Recent imaging from June 2025 shows lung nodules requiring  further monitoring. No new symptoms such as chest pain, dyspnea, nausea, vomiting, or diarrhea. Wheezing present, managed with inhalers and pulmonologist care. - Order follow-up lung scan in 3 months at our facility for comparison with previous scans. - Schedule a video visit to discuss scan results post-scan. - Coordinate with radiology to compare new scan with previous images. He was advised to call immediately if he has any concerning symptoms in the interval. The patient voices understanding of current disease status and treatment options and is in agreement with  the current care plan.  All questions were answered. The patient knows to call the clinic with any problems, questions or concerns. We can certainly see the patient much sooner if necessary.  The total time spent in the appointment was 20 minutes.  Disclaimer: This note was dictated with voice recognition software. Similar sounding words can inadvertently be transcribed and may not be corrected upon review.

## 2023-10-19 ENCOUNTER — Telehealth: Payer: Self-pay | Admitting: Internal Medicine

## 2023-10-19 NOTE — Telephone Encounter (Signed)
 Left the patient a voicemail with the scheduled appointment details.

## 2023-10-24 ENCOUNTER — Other Ambulatory Visit: Payer: Self-pay | Admitting: Pulmonary Disease

## 2023-12-26 ENCOUNTER — Other Ambulatory Visit: Payer: Self-pay | Admitting: Pulmonary Disease

## 2024-01-02 ENCOUNTER — Other Ambulatory Visit: Payer: Self-pay | Admitting: Pulmonary Disease

## 2024-01-02 NOTE — Telephone Encounter (Signed)
 Courtesy refill

## 2024-01-08 ENCOUNTER — Telehealth: Payer: Self-pay | Admitting: Pulmonary Disease

## 2024-01-08 NOTE — Telephone Encounter (Signed)
 Copied from CRM [id number]. Topic: Appointments - Appointment Scheduling >> Jan 08, 2024 12:45 PM Brendan Lara wrote: Patient is calling to schedule an appointment with Dr.olalere for his medication refill of Trelegy. Patient already requested refill it was denied as appt is needed. Dr.Olalere does not have any availability until January. Patient stated he is about to run out of his medication and needs an appt soon, contacted [facility] [place] spoke with [name] stated to send CRM to inquire for potential double booking. Advised pt a f/u call would be made. Patient was thankful and verbalized understanding.    Looks as a courtesy refill was completed on 01/02/2024. Seeking approval to schedule him in a held slot coming up prior to refill running out with Dr. Neda.

## 2024-01-08 NOTE — Telephone Encounter (Signed)
 Called patient,got him scheduled.NFN

## 2024-01-09 ENCOUNTER — Encounter: Payer: Self-pay | Admitting: Pulmonary Disease

## 2024-01-09 ENCOUNTER — Ambulatory Visit (HOSPITAL_COMMUNITY)
Admission: RE | Admit: 2024-01-09 | Discharge: 2024-01-09 | Disposition: A | Discharge status: Home / Self Care | Payer: Medicare (Managed Care) | Source: Ambulatory Visit | Attending: Internal Medicine | Admitting: Internal Medicine

## 2024-01-09 ENCOUNTER — Inpatient Hospital Stay: Payer: Medicare (Managed Care) | Attending: Physician Assistant

## 2024-01-09 ENCOUNTER — Ambulatory Visit: Payer: Medicare (Managed Care) | Admitting: Pulmonary Disease

## 2024-01-09 VITALS — BP 110/60 | HR 73 | Ht 71.0 in | Wt 123.8 lb

## 2024-01-09 DIAGNOSIS — C3490 Malignant neoplasm of unspecified part of unspecified bronchus or lung: Secondary | ICD-10-CM

## 2024-01-09 DIAGNOSIS — J449 Chronic obstructive pulmonary disease, unspecified: Secondary | ICD-10-CM

## 2024-01-09 DIAGNOSIS — C3491 Malignant neoplasm of unspecified part of right bronchus or lung: Secondary | ICD-10-CM | POA: Insufficient documentation

## 2024-01-09 LAB — CMP (CANCER CENTER ONLY)
ALT: 11 U/L (ref 0–44)
AST: 16 U/L (ref 15–41)
Albumin: 4.2 g/dL (ref 3.5–5.0)
Alkaline Phosphatase: 67 U/L (ref 38–126)
Anion gap: 6 (ref 5–15)
BUN: 11 mg/dL (ref 8–23)
CO2: 30 mmol/L (ref 22–32)
Calcium: 9.5 mg/dL (ref 8.9–10.3)
Chloride: 102 mmol/L (ref 98–111)
Creatinine: 0.87 mg/dL (ref 0.61–1.24)
GFR, Estimated: >60 mL/min (ref >60)
Glucose, Bld: 90 mg/dL (ref 70–99)
Potassium: 4.4 mmol/L (ref 3.5–5.1)
Sodium: 138 mmol/L (ref 135–145)
Total Bilirubin: 0.4 mg/dL (ref 0.0–1.2)
Total Protein: 6.8 g/dL (ref 6.5–8.1)

## 2024-01-09 LAB — CBC WITH DIFFERENTIAL (CANCER CENTER ONLY)
Abs Immature Granulocytes: 0.02 K/uL (ref 0.00–0.07)
Basophils Absolute: 0.1 K/uL (ref 0.0–0.1)
Basophils Relative: 1 %
Eosinophils Absolute: 0.1 K/uL (ref 0.0–0.5)
Eosinophils Relative: 2 %
HCT: 48.4 % (ref 39.0–52.0)
Hemoglobin: 16.9 g/dL (ref 13.0–17.0)
Immature Granulocytes: 0 %
Lymphocytes Relative: 27 %
Lymphs Abs: 1.9 K/uL (ref 0.7–4.0)
MCH: 32.3 pg (ref 26.0–34.0)
MCHC: 34.9 g/dL (ref 30.0–36.0)
MCV: 92.5 fL (ref 80.0–100.0)
Monocytes Absolute: 0.7 K/uL (ref 0.1–1.0)
Monocytes Relative: 10 %
Neutro Abs: 4.3 K/uL (ref 1.7–7.7)
Neutrophils Relative %: 60 %
Platelet Count: 172 K/uL (ref 150–400)
RBC: 5.23 MIL/uL (ref 4.22–5.81)
RDW: 12.6 % (ref 11.5–15.5)
WBC Count: 7.1 K/uL (ref 4.0–10.5)
nRBC: 0 % (ref 0.0–0.2)

## 2024-01-09 MED ORDER — SODIUM CHLORIDE (PF) 0.9 % IJ SOLN
INTRAMUSCULAR | Status: AC
  Filled 2024-01-09: qty 50

## 2024-01-09 MED ORDER — IOHEXOL 300 MG/ML  SOLN
75.0000 mL | Freq: Once | INTRAMUSCULAR | Status: AC | PRN
Start: 2024-01-09 — End: 2024-01-09
  Administered 2024-01-09: 75 mL via INTRAVENOUS

## 2024-01-09 MED ORDER — TRELEGY ELLIPTA 200-62.5-25 MCG/ACT IN AEPB: 1.0000 | INHALATION_SPRAY | Freq: Every day | RESPIRATORY_TRACT | 6 refills | Status: AC

## 2024-01-09 MED ORDER — ALBUTEROL SULFATE HFA 108 (90 BASE) MCG/ACT IN AERS: 2.0000 | INHALATION_SPRAY | Freq: Four times a day (QID) | RESPIRATORY_TRACT | 6 refills | Status: AC | PRN

## 2024-01-09 NOTE — Progress Notes (Signed)
 Brendan Lara    [id number]    Dec 06, 1955  Primary Care Physician:[facility]  Referring Physician: [facility] [address] [place],  [place] [address]  Chief complaint:   Patient with obstructive lung disease  HPI:  Patient with obstructive lung disease Adenocarcinoma of the lung status post stereotactic radiation treatment Most recent CT shows improvement in nodules  Requires close monitoring with CT scans  He does have shortness of breath on exertion  Since her last visit and here He had suffered a broken rib, developed a pneumonia  Complications from cataract surgery  Over the last mornings been doing a little bit more Has noticed improvement in his breathing  Denies any chest pains or chest discomfort  Has been compliant with Trelegy  Can walk about a mile  His PFT had shown obstructive disease with significant bronchodilator response   Outpatient Encounter Medications as of 01/09/2024  Medication Sig   traZODone (DESYREL) 50 MG tablet Take 50 mg by mouth at bedtime.   [DISCONTINUED] albuterol  (VENTOLIN  HFA) 108 (90 Base) MCG/ACT inhaler Inhale 2 puffs into the lungs every 6 (six) hours as needed for wheezing or shortness of breath.   [DISCONTINUED] Fluticasone-Umeclidin-Vilant (TRELEGY ELLIPTA ) 200-62.5-25 MCG/ACT AEPB INHALE 1 PUFF BY MOUTH EVERY DAY   albuterol  (VENTOLIN  HFA) 108 (90 Base) MCG/ACT inhaler Inhale 2 puffs into the lungs every 6 (six) hours as needed for wheezing or shortness of breath.   doxycycline  (VIBRA -TABS) 100 MG tablet Take 1 tablet (100 mg total) by mouth 2 (two) times daily. (Patient not taking: Reported on 01/09/2024)   Fluticasone-Umeclidin-Vilant (TRELEGY ELLIPTA ) 100-62.5-25 MCG/ACT AEPB Inhale 1 puff into the lungs every morning. (Patient not taking: Reported on 01/09/2024)   Fluticasone-Umeclidin-Vilant (TRELEGY ELLIPTA ) 200-62.5-25 MCG/ACT AEPB Inhale 1 puff into  the lungs daily.   No facility-administered encounter medications on file as of 01/09/2024.    Allergies as of 01/09/2024 - Review Complete 01/09/2024  Allergen Reaction Noted   Codeine Shortness Of Breath and Nausea And Vomiting 08/29/2013   Aspirin Other (See Comments) 08/18/2022   Atorvastatin Other (See Comments) 08/18/2022   Hydrocodone-acetaminophen  Other (See Comments) 08/18/2022   Influenza vaccines  07/04/2022   Other Diarrhea and Nausea And Vomiting 06/15/2022   Tiotropium bromide  07/04/2022    Past Medical History:  Diagnosis Date   Arthritis    Complication of anesthesia    Depression    Dyspnea    History of radiation therapy    Right Lower Lung   History of radiation therapy    Adenocarcinoma of Right Lower Lung- 09/07/2022 - 09/12/2022 - Dr. Lynwood Nasuti   PONV (postoperative nausea and vomiting)     Past Surgical History:  Procedure Laterality Date   BRONCHIAL BIOPSY  06/20/2022   Procedure: BRONCHIAL BIOPSIES;  Surgeon: Shelah Lamar RAMAN, MD;  Location: [facility] ENDOSCOPY;  Service: Pulmonary;;   BRONCHIAL BRUSHINGS  06/20/2022   Procedure: BRONCHIAL BRUSHINGS;  Surgeon: Shelah Lamar RAMAN, MD;  Location: [facility] ENDOSCOPY;  Service: Pulmonary;;   BRONCHIAL NEEDLE ASPIRATION BIOPSY  06/20/2022   Procedure: BRONCHIAL NEEDLE ASPIRATION BIOPSIES;  Surgeon: Shelah Lamar RAMAN, MD;  Location: [facility] ENDOSCOPY;  Service: Pulmonary;;   FACIAL RECONSTRUCTION SURGERY     head surgery     HEMOSTASIS CONTROL  06/20/2022   Procedure: HEMOSTASIS CONTROL;  Surgeon: Shelah Lamar RAMAN, MD;  Location: [facility] ENDOSCOPY;  Service: Pulmonary;;   NECK SURGERY  SHOULDER SURGERY     VIDEO BRONCHOSCOPY WITH RADIAL ENDOBRONCHIAL ULTRASOUND  06/20/2022   Procedure: VIDEO BRONCHOSCOPY WITH RADIAL ENDOBRONCHIAL ULTRASOUND;  Surgeon: Shelah Lamar RAMAN, MD;  Location: [facility] ENDOSCOPY;  Service: Pulmonary;;    Family History  Problem Relation Age of Onset   Thyroid disease Mother    Alzheimer's disease Mother    Heart  failure Father     Social History   Socioeconomic History   Marital status: Single    Spouse name: Not on file   Number of children: Not on file   Years of education: Not on file   Highest education level: Not on file  Occupational History   Not on file  Tobacco Use   Smoking status: Every Day    Current packs/day: 0.50    Average packs/day: 0.5 packs/day for 51.0 years (25.5 ttl pk-yrs)    Types: Cigarettes   Smokeless tobacco: Never   Tobacco comments:    Smoking about 1/2ppd 01/09/24  Vaping Use   Vaping status: Never Used  Substance and Sexual Activity   Alcohol use: Not Currently   Drug use: Yes    Types: Marijuana    Comment: once weekly   Sexual activity: Not Currently  Other Topics Concern   Not on file  Social History Narrative   Not on file   Social Drivers of Health   Financial Resource Strain: Not on file  Food Insecurity: Food Insecurity Present (08/18/2022)   Hunger Vital Sign    Worried About Running Out of Food in the Last Year: Often true    Ran Out of Food in the Last Year: Often true  Transportation Needs: No Transportation Needs (08/18/2022)   PRAPARE - Administrator, Civil Service (Medical): No    Lack of Transportation (Non-Medical): No  Physical Activity: Not on file  Stress: Not on file  Social Connections: Not on file  Intimate Partner Violence: Not At Risk (08/18/2022)   Humiliation, Afraid, Rape, and Kick questionnaire    Fear of Current or Ex-Partner: No    Emotionally Abused: No    Physically Abused: No    Sexually Abused: No    Review of Systems  Constitutional:  Positive for fatigue.  Respiratory:  Positive for shortness of breath.     Vitals:   01/09/24 1457  BP: 110/60  Pulse: 73  SpO2: 96%     Physical Exam Constitutional:      Appearance: Normal appearance.  HENT:     Head: Normocephalic.     Mouth/Throat:     Mouth: Mucous membranes are moist.  Eyes:     General: No scleral  icterus. Cardiovascular:     Rate and Rhythm: Normal rate and regular rhythm.  Pulmonary:     Effort: No respiratory distress.     Breath sounds: No stridor. No wheezing or rhonchi.  Musculoskeletal:     Cervical back: No rigidity or tenderness.  Neurological:     Mental Status: He is alert.  Psychiatric:        Mood and Affect: Mood normal.    Data Reviewed: Most recent CT scan of the chest reviewed by myself  PFT reviewed showing severe obstructive disease with significant bronchodilator response-reviewed with the patient during the visit  Assessment:  Active smoker -Smoking cessation counseling  Severe obstructive disease - PFT with severe obstructive disease with significant bronchodilator response, evidence of air trapping  Adenocarcinoma of the lung -S/p stereotactic radiation  Abnormal CT scan of  the chest -Radiological monitoring  Deconditioning -Graded activities as tolerated - Activity tolerance is improving since his recent decompensation  Plan/Recommendations: Smoking cessation counseling  Continue Trelegy 200  Encouraged to continue staying active  CT scan monitoring of lung nodules  Albuterol  as needed  Call us  with significant concerns  Follow-up in about 6 months    Jennet Epley MD [facility] 01/09/2024, 3:06 PM  CC: [facility]

## 2024-01-09 NOTE — Patient Instructions (Addendum)
 Continue Trelegy  Use albuterol  as needed  Graded activities as tolerated - Make sure you are exercising regularly  Call us  with significant concerns  Follow-up in 6 months

## 2024-01-16 ENCOUNTER — Telehealth: Payer: Medicare (Managed Care) | Admitting: Internal Medicine

## 2024-01-16 ENCOUNTER — Inpatient Hospital Stay: Payer: Medicare (Managed Care) | Admitting: Internal Medicine

## 2024-01-16 ENCOUNTER — Telehealth: Payer: Self-pay | Admitting: *Deleted

## 2024-01-16 DIAGNOSIS — C3491 Malignant neoplasm of unspecified part of right bronchus or lung: Secondary | ICD-10-CM

## 2024-01-16 NOTE — Telephone Encounter (Signed)
 Called pt to make aware that video visit would have to be in person due to new temporary government guidelines with particular insurances. Pt stated that he is 200 miles away and can not come for visit. Advised that we can do a visit in person to go over scans. Pt agreed and verbalized understanding to come in on Nov 3rd at 3:30 pm for appt.

## 2024-01-16 NOTE — Progress Notes (Signed)
 This encounter was created in error - please disregard.

## 2024-01-19 NOTE — Progress Notes (Signed)
 [facility] OFFICE PROGRESS NOTE  [facility]ates, [facility] [address] [place] [address]  DIAGNOSIS: Stage IA (T1b, N0, M0) non-small cell lung cancer, adenocarcinoma presented with a nodule along the major fissure of the right lung involving the posterior right middle lobe and the anterior right lower lobe diagnosed in April 2024. The patient also has few other scattered cystic nonsolid nodules on both lungs that need close monitoring.    Biomarker Findings Microsatellite status - MS-Stable Tumor Mutational Burden - 5 Muts/Mb Genomic Findings For a complete list of the genes assayed, please refer to the Appendix. MTOR L1460P STK11 G196V CDKN2A p16INK4a loss and p14ARF loss exons 2-3 PBRM1 R1160* 8 Disease relevant genes with no reportable alterations: ALK, BRAF, EGFR, ERBB2, KRAS, MET, RET,ROS1   PDL1 TPS 0%   PRIOR THERAPY: Status post curative SBRT to the right lung nodule under the care of Dr. Shannon completed on September 13, 2022   CURRENT THERAPY: Observation   INTERVAL HISTORY: Brendan Lara 68 y.o. male returns to the clinic today for a follow-up visit.  The patient was last seen by Dr. [name] on 10/17/23.   The last imaging was performed in [place], [place]  had some bilateral pulmonary nodules with CTA in the ER. The scan was performed on 08/29/2023 which showed clustered central lobar nodular opacities in the right middle lobe and lower lobe which is likely infectious and inflammatory as well as scattered bilateral pulmonary nodules measuring up to 1.2 cm.  Therefore, Dr. [name] recommended a 34-month follow-up scan.  Of note the patient recently did have a follow-up CT scan which was compared to the March 2025 scan as opposed to the June 2025 scan that was performed at [facility] in [place], [place] in June 2025.    He denies any fever, chills, night sweats, or unexplained weight loss.  He denies any chest pain or  hemoptysis. He sees Dr. Neda from pulmonary medicine who he had a visit with recently on 01/09/24.  Baseline intermittent cough and shortness of breath which overall is improved compared to when he was seen last time.  He sometimes has right rib discomfort which he takes an Advil.  He denies any unusual shortness of breath.  He denies any nausea, vomiting, or diarrhea.  He denies any changes with constipation.  He has had some headaches.  He states he is prone to headaches.  He does not take anything for this.  He recently had a restaging CT scan.  He is here today for evaluation and to review his scan results.  MEDICAL HISTORY: Past Medical History:  Diagnosis Date   Arthritis    Complication of anesthesia    Depression    Dyspnea    History of radiation therapy    Right Lower Lung   History of radiation therapy    Adenocarcinoma of Right Lower Lung- 09/07/2022 - 09/12/2022 - Dr. Lynwood Shannon   PONV (postoperative nausea and vomiting)     ALLERGIES:  is allergic to codeine, aspirin, atorvastatin, hydrocodone-acetaminophen , influenza vaccines, other, and tiotropium bromide.  MEDICATIONS:  Current Outpatient Medications  Medication Sig Dispense Refill   albuterol  (VENTOLIN  HFA) 108 (90 Base) MCG/ACT inhaler Inhale 2 puffs into the lungs every 6 (six) hours as needed for wheezing or shortness of breath. 8 g 6   doxycycline  (VIBRA -TABS) 100 MG tablet Take 1 tablet (100 mg total) by mouth 2 (two) times daily. (Patient not taking: Reported on 01/09/2024)  14 tablet 0   Fluticasone-Umeclidin-Vilant (TRELEGY ELLIPTA ) 100-62.5-25 MCG/ACT AEPB Inhale 1 puff into the lungs every morning. (Patient not taking: Reported on 01/09/2024) 28 each 1   Fluticasone-Umeclidin-Vilant (TRELEGY ELLIPTA ) 200-62.5-25 MCG/ACT AEPB Inhale 1 puff into the lungs daily. 60 each 6   traZODone (DESYREL) 50 MG tablet Take 50 mg by mouth at bedtime.     No current facility-administered medications for this visit.     SURGICAL HISTORY:  Past Surgical History:  Procedure Laterality Date   BRONCHIAL BIOPSY  06/20/2022   Procedure: BRONCHIAL BIOPSIES;  Surgeon: Shelah Lamar RAMAN, MD;  Location: [facility] ENDOSCOPY;  Service: Pulmonary;;   BRONCHIAL BRUSHINGS  06/20/2022   Procedure: BRONCHIAL BRUSHINGS;  Surgeon: Shelah Lamar RAMAN, MD;  Location: [facility] ENDOSCOPY;  Service: Pulmonary;;   BRONCHIAL NEEDLE ASPIRATION BIOPSY  06/20/2022   Procedure: BRONCHIAL NEEDLE ASPIRATION BIOPSIES;  Surgeon: Shelah Lamar RAMAN, MD;  Location: [facility] ENDOSCOPY;  Service: Pulmonary;;   FACIAL RECONSTRUCTION SURGERY     head surgery     HEMOSTASIS CONTROL  06/20/2022   Procedure: HEMOSTASIS CONTROL;  Surgeon: Shelah Lamar RAMAN, MD;  Location: [facility] ENDOSCOPY;  Service: Pulmonary;;   NECK SURGERY     SHOULDER SURGERY     VIDEO BRONCHOSCOPY WITH RADIAL ENDOBRONCHIAL ULTRASOUND  06/20/2022   Procedure: VIDEO BRONCHOSCOPY WITH RADIAL ENDOBRONCHIAL ULTRASOUND;  Surgeon: Shelah Lamar RAMAN, MD;  Location: [facility] ENDOSCOPY;  Service: Pulmonary;;    REVIEW OF SYSTEMS:   Review of Systems  Constitutional: Negative for appetite change, chills, fatigue, fever and unexpected weight change.  HENT: Negative for mouth sores, nosebleeds, sore throat and trouble swallowing.   Eyes: Negative for eye problems and icterus.  Respiratory: Stable to improve shortness of breath and cough.  Negative for  hemoptysis and wheezing.   Cardiovascular: Negative for chest pain and leg swelling.  Gastrointestinal: Negative for abdominal pain, constipation, diarrhea, nausea and vomiting.  Genitourinary: Negative for bladder incontinence, difficulty urinating, dysuria, frequency and hematuria.   Musculoskeletal: Negative for back pain, gait problem, neck pain and neck stiffness.  Skin: Negative for itching and rash.  Neurological: Negative for dizziness, extremity weakness, gait problem, headaches, light-headedness and seizures.  Hematological: Negative for adenopathy. Does not bruise/bleed  easily.  Psychiatric/Behavioral: Negative for confusion, depression and sleep disturbance. The patient is not nervous/anxious.     PHYSICAL EXAMINATION:  There were no vitals taken for this visit.  ECOG PERFORMANCE STATUS: 1  Physical Exam  Constitutional: Oriented to person, place, and time and thin appearing male and in no distress.  HENT:  Head: Normocephalic and atraumatic.  Mouth/Throat: Oropharynx is clear and moist. No oropharyngeal exudate.  Eyes: Conjunctivae are normal. Right eye exhibits no discharge. Left eye exhibits no discharge. No scleral icterus.  Neck: Normal range of motion. Neck supple.  Cardiovascular: Normal rate, regular rhythm, normal heart sounds and intact distal pulses.   Pulmonary/Chest: Effort normal. Quiet breath sounds bilaterally. No respiratory distress. No wheezes. No rales.  Abdominal: Soft. Bowel sounds are normal. Exhibits no distension and no mass. There is no tenderness.  Musculoskeletal: Normal range of motion. Exhibits no edema.  Lymphadenopathy:    No cervical adenopathy.  Neurological: Alert and oriented to person, place, and time. Exhibits muscle wasting. Gait normal. Coordination normal.  Skin: Skin is warm and dry. No rash noted. Not diaphoretic. No erythema. No pallor.  Psychiatric: Mood, memory and judgment normal.  Vitals reviewed.  LABORATORY DATA: Lab Results  Component Value Date   WBC 7.1 01/09/2024   HGB  16.9 01/09/2024   HCT 48.4 01/09/2024   MCV 92.5 01/09/2024   PLT 172 01/09/2024      Chemistry      Component Value Date/Time   NA 138 01/09/2024 1117   K 4.4 01/09/2024 1117   CL 102 01/09/2024 1117   CO2 30 01/09/2024 1117   BUN 11 01/09/2024 1117   CREATININE 0.87 01/09/2024 1117      Component Value Date/Time   CALCIUM 9.5 01/09/2024 1117   ALKPHOS 67 01/09/2024 1117   AST 16 01/09/2024 1117   ALT 11 01/09/2024 1117   BILITOT 0.4 01/09/2024 1117       RADIOGRAPHIC STUDIES:  CT Chest W  Contrast Result Date: 01/11/2024 CLINICAL DATA:  Non-small-cell lung cancer restaging * Tracking Code: BO * EXAM: CT CHEST WITH CONTRAST TECHNIQUE: Multidetector CT imaging of the chest was performed during intravenous contrast administration. RADIATION DOSE REDUCTION: This exam was performed according to the departmental dose-optimization program which includes automated exposure control, adjustment of the mA and/or kV according to patient size and/or use of iterative reconstruction technique. CONTRAST:  75mL OMNIPAQUE  IOHEXOL  300 MG/ML  SOLN COMPARISON:  05/31/2023 FINDINGS: Cardiovascular: Scattered thoracic aortic atherosclerosis. Normal heart size. No pericardial effusion. Mediastinum/Nodes: No enlarged mediastinal, hilar, or axillary lymph nodes. Thyroid gland, trachea, and esophagus demonstrate no significant findings. Lungs/Pleura: Moderate centrilobular and paraseptal emphysema. Diffuse bilateral bronchial wall thickening. Unchanged dominant spiculated nodule spanning the major fissure in the posterior lateral segment right middle lobe and right lower lobe measuring 1.5 x 1.0 cm (series 8, image 120). Interval increase in size and solid character of a nodule in the anterior right apex measuring 0.8 cm, previously subsolid and measuring 0.5 cm (series 8, image 23). Nearly complete resolution of a previously seen small nodule in the posterior right apex, now a linear residual measuring 0.5 cm (series 8, image 29). New nodule in the central right upper lobe measuring 0.8 cm (series 8, image 42). Additional small bilateral pulmonary nodules not significantly changed, for example a 0.8 cm nodule of the peripheral left upper lobe (series 8, image 37). No pleural effusion or pneumothorax. Upper Abdomen: No acute abnormality. Musculoskeletal: No chest wall abnormality. No acute osseous findings. IMPRESSION: 1. Unchanged dominant spiculated nodule spanning the major fissure in the posterior lateral segment right  middle lobe and right lower lobe measuring 1.5 x 1.0 cm. 2. Interval increase in size and solid character of a nodule in the anterior right apex measuring 0.8 cm, previously subsolid and measuring 0.5 cm. 3. Nearly complete resolution of a previously seen small nodule in the posterior right apex, now a linear residual measuring 0.5 cm. New nodule in the central right upper lobe measuring 0.8 cm. Additional small bilateral pulmonary nodules not significantly changed. Fluctuating behavior of the smaller nodules is strongly suggestive of waxing and waning infection although all nodules warrant careful attention on follow-up in this setting. 4. Emphysema and diffuse bilateral bronchial wall thickening. Aortic Atherosclerosis (ICD10-I70.0) and Emphysema (ICD10-J43.9). Electronically Signed   By: Marolyn JONETTA Jaksch M.D.   On: 01/11/2024 07:01     ASSESSMENT/PLAN:  Is a very pleasant 68 year old Caucasian male diagnosed initially with stage I T1b, N0, M0) non-small cell lung cancer, adenocarcinoma.  He presented with a nodule on the major fissure of the right lung involving the posterior right middle lobe and anterior right lower lobe which was diagnosed in April 2024.  He also had a few other scattered cystic nonsolid nodules in both lungs that need close  monitoring.  His molecular studies showed no actionable mutations and negative PD-L1 expression.  He underwent curative SBRT to the right lung nodule under the care of Dr. Shannon.  The patient has been having some waxing and waning pulmonary nodules.  He was found to have some worsening bilateral pulmonary nodules in June/July 2025 for which Dr. [name] wanted to monitor him closely with follow-up CT scan in 3 months.  The patient was seen with Dr. [name] today.  Dr. [name] personally and independently reviewed the scan and discussed results with the patient today.  The scan showed increase in size of the nodule in the right apex.  And a complete resolution of  the previously seen nodule in the right apex.  Of note his current scan was compared to the scan from March 2025 as opposed to the CT angio that was performed at [facility] in June 2025..  Dr. [name] recommends close monitoring for now with a repeat CT scan of the chest in 3 months.  Will see him approximately 1 week later to review the results in the office.  The patient was advised to call immediately if he has any concerning symptoms in the interval. The patient voices understanding of current disease status and treatment options and is in agreement with the current care plan. All questions were answered. The patient knows to call the clinic with any problems, questions or concerns. We can certainly see the patient much sooner if necessary  No orders of the defined types were placed in this encounter.   Damarion Mendizabal L Wonder Donaway, PA-C 01/19/24  ADDENDUM: Hematology/Oncology Attending: I had a face-to-face encounter with the patient today.  I reviewed his record, lab, scan and recommended his care plan.  This is a very pleasant 68 years old white male with a stage Ia non-small cell lung cancer, adenocarcinoma diagnosed in April 2024 with no actionable mutation and negative PD-L1 expression.  He is status post SBRT to right lung nodule and has been in observation since that time.  The patient had repeat CT scan of the chest performed recently.  I personally and independently reviewed the scan images and discussed the result with the patient today.  His scan showed stable disease in the dominant posterior lateral segment right middle lobe and right lower lobe measuring 1.5 x 1.0 cm but there was slight interval increase and solid character of the nodule in the anterior right apex measuring 0.8 cm and previously measured 0.5 cm.  There was complete resolution of the previously seen a small nodule in the posterior right apex.  There was also a new nodule in the central right upper lobe measuring  0.8 cm.  This nodule has been waxing and waning suspicious for inflammatory process. I recommended for the patient to continue on observation with repeat CT scan of the chest in 3 months for restaging of his disease. He was advised to call immediately if he has any other concerning symptoms in the interval. Disclaimer: This note was dictated with voice recognition software. Similar sounding words can inadvertently be transcribed and may be missed upon review. [name] MARLA [name], MD

## 2024-01-22 ENCOUNTER — Inpatient Hospital Stay: Payer: Medicare (Managed Care) | Attending: Physician Assistant | Admitting: Physician Assistant

## 2024-01-22 VITALS — BP 121/64 | HR 87 | Temp 97.4°F | Resp 17 | Ht 71.0 in | Wt 124.9 lb

## 2024-01-22 DIAGNOSIS — C3490 Malignant neoplasm of unspecified part of unspecified bronchus or lung: Secondary | ICD-10-CM

## 2024-01-22 DIAGNOSIS — C3491 Malignant neoplasm of unspecified part of right bronchus or lung: Secondary | ICD-10-CM | POA: Insufficient documentation

## 2024-01-22 DIAGNOSIS — Z923 Personal history of irradiation: Secondary | ICD-10-CM | POA: Diagnosis not present

## 2024-02-14 ENCOUNTER — Telehealth: Payer: Self-pay

## 2024-02-14 NOTE — Telephone Encounter (Signed)
 Faxed clearance form to [facility] with confirmation at [phone].

## 2024-02-23 ENCOUNTER — Encounter (HOSPITAL_COMMUNITY): Payer: Self-pay

## 2024-04-10 ENCOUNTER — Telehealth: Payer: Self-pay

## 2024-04-10 ENCOUNTER — Telehealth: Payer: Self-pay | Admitting: Internal Medicine

## 2024-04-10 NOTE — Telephone Encounter (Signed)
 Spoke with patient in regards to lab appt.  He states that he would like his lab appt on the same day at his CT scan.  Rescheduled patients lab appt for 04/25/24 @ 1 PM prior to CT scan @ 2 PM. He voiced understanding.

## 2024-04-23 ENCOUNTER — Inpatient Hospital Stay: Payer: Medicare (Managed Care)

## 2024-04-25 ENCOUNTER — Inpatient Hospital Stay: Payer: Medicare (Managed Care)

## 2024-04-25 ENCOUNTER — Ambulatory Visit (HOSPITAL_COMMUNITY): Payer: Medicare (Managed Care)

## 2024-04-30 ENCOUNTER — Inpatient Hospital Stay: Payer: Medicare (Managed Care) | Admitting: Physician Assistant

## 2024-05-23 ENCOUNTER — Other Ambulatory Visit (HOSPITAL_COMMUNITY): Payer: Medicare (Managed Care)

## 2024-05-23 ENCOUNTER — Inpatient Hospital Stay: Payer: Medicare (Managed Care) | Attending: Physician Assistant

## 2024-05-29 ENCOUNTER — Inpatient Hospital Stay: Payer: Medicare (Managed Care) | Admitting: Physician Assistant
